# Patient Record
Sex: Female | Born: 1990 | Race: White | Hispanic: No | Marital: Married | Smoking: Current every day smoker
Health system: Southern US, Community
[De-identification: ages and names within clinical notes are randomized; demographics above are authoritative.]

## PROBLEM LIST (undated history)

## (undated) ENCOUNTER — Inpatient Hospital Stay (HOSPITAL_COMMUNITY): Payer: Self-pay

## (undated) DIAGNOSIS — K219 Gastro-esophageal reflux disease without esophagitis: Secondary | ICD-10-CM

## (undated) DIAGNOSIS — O039 Complete or unspecified spontaneous abortion without complication: Secondary | ICD-10-CM

## (undated) DIAGNOSIS — S3609XA Other injury of spleen, initial encounter: Secondary | ICD-10-CM

## (undated) DIAGNOSIS — N390 Urinary tract infection, site not specified: Secondary | ICD-10-CM

## (undated) DIAGNOSIS — Z9889 Other specified postprocedural states: Secondary | ICD-10-CM

## (undated) DIAGNOSIS — R112 Nausea with vomiting, unspecified: Secondary | ICD-10-CM

## (undated) DIAGNOSIS — O24419 Gestational diabetes mellitus in pregnancy, unspecified control: Secondary | ICD-10-CM

## (undated) HISTORY — PX: MOUTH SURGERY: SHX715

## (undated) HISTORY — DX: Gastro-esophageal reflux disease without esophagitis: K21.9

---
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
## 2012-05-22 ENCOUNTER — Emergency Department (HOSPITAL_COMMUNITY)
Admission: EM | Admit: 2012-05-22 | Discharge: 2012-05-22 | Disposition: A | Discharge status: Home / Self Care | Payer: 59 | Attending: Emergency Medicine | Admitting: Emergency Medicine

## 2012-05-22 ENCOUNTER — Encounter (HOSPITAL_COMMUNITY): Payer: Self-pay | Admitting: *Deleted

## 2012-05-22 DIAGNOSIS — M25569 Pain in unspecified knee: Secondary | ICD-10-CM | POA: Insufficient documentation

## 2012-05-22 DIAGNOSIS — Y9389 Activity, other specified: Secondary | ICD-10-CM | POA: Insufficient documentation

## 2012-05-22 DIAGNOSIS — Z862 Personal history of diseases of the blood and blood-forming organs and certain disorders involving the immune mechanism: Secondary | ICD-10-CM | POA: Insufficient documentation

## 2012-05-22 DIAGNOSIS — Y929 Unspecified place or not applicable: Secondary | ICD-10-CM | POA: Insufficient documentation

## 2012-05-22 DIAGNOSIS — M79609 Pain in unspecified limb: Secondary | ICD-10-CM | POA: Insufficient documentation

## 2012-05-22 DIAGNOSIS — F17200 Nicotine dependence, unspecified, uncomplicated: Secondary | ICD-10-CM | POA: Insufficient documentation

## 2012-05-22 DIAGNOSIS — Z888 Allergy status to other drugs, medicaments and biological substances status: Secondary | ICD-10-CM | POA: Insufficient documentation

## 2012-05-22 DIAGNOSIS — T7840XA Allergy, unspecified, initial encounter: Secondary | ICD-10-CM

## 2012-05-22 HISTORY — DX: Complete or unspecified spontaneous abortion without complication: O03.9

## 2012-05-22 HISTORY — DX: Other injury of spleen, initial encounter: S36.09XA

## 2012-05-22 NOTE — ED Notes (Signed)
States she has not taken Benadryl or any medication since this morning.

## 2012-05-22 NOTE — ED Notes (Signed)
Pt states she thinks she might be allergic to Advil.  Took this morning and around 11 am started noticing joint pain, later in the day noticed rash mostly on arms.  Denies itching, denies SOB, no difficulty breathing.

## 2012-05-22 NOTE — ED Provider Notes (Signed)
History    This chart was scribed for non-physician practitioner working with Catherine Razor, MD by Sofie Rower, ED Scribe. This patient was seen in room TR09C/TR09C and the patient's care was started at 9:07Pm.   CSN: [id number]  Arrival date & time 05/22/12  1945   First MD Initiated Contact with Patient 05/22/12 2107      Chief Complaint  Patient presents with  . Allergic Reaction    (Consider location/radiation/quality/duration/timing/severity/associated sxs/prior treatment) Patient is a 22 y.o. female presenting with allergic reaction and leg pain. The history is provided by the patient. No language interpreter was used.  Allergic Reaction The primary symptoms are  rash. The primary symptoms do not include shortness of breath. The current episode started 6 to 12 hours ago (10 hours ago). The problem has been gradually improving. This is a new problem.  The rash began today. Location: Previously noted rash has not resolved.  The pain associated with the rash is mild. The rash is not associated with itching.  Associated with: taking Advil at 11:00AM today. Significant symptoms that are not present include itching.  Leg Pain Location:  Leg Time since incident:  10 hours Injury: no   Leg location:  L leg and R leg Pain details:    Quality:  Aching   Radiates to:  Does not radiate   Severity:  Moderate   Onset quality:  Sudden   Duration:  10 hours   Timing:  Constant   Progression:  Improving Chronicity:  New Dislocation: no   Foreign body present:  No foreign bodies Tetanus status:  Unknown Prior injury to area:  No Relieved by:  Rest Worsened by:  Nothing tried Ineffective treatments:  None tried Associated symptoms: no itching     Catherine Ruiz is a 22 y.o. 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  Associated symptoms include leg pain located at the bilateral lower extremities. The pt reports she took Advil (X 2) this morning around 11:00AM, and shortly there after began to notice a diffuse rash, similar towards a prior experience when she ingested sulfa antibiotics. The pt informs that at the time of physical exam, the rash has now improved and dissipated. Modifying factors include ambulation which intensifies the bilateral lower extremity pain.  The pt denies sore throat and shortness of breath.  The pt is a current everyday smoker, however, she does not drink alcohol.   Pt does not have a PCP.    Past Medical History  Diagnosis Date  . Ruptured spleen   . Miscarriage     Past Surgical History  Procedure Laterality Date  . Appendectomy      History reviewed. No pertinent family history.  History  Substance Use Topics  . Smoking status: Current Every Day Smoker -- 1.00 packs/day  . Smokeless tobacco: Not on file  . Alcohol Use: No    OB History   Grav Para Term Preterm Abortions TAB SAB Ect Mult Living                  Review of Systems  HENT: Negative for sore throat.   Respiratory: Negative for shortness of breath.   Musculoskeletal: Positive for arthralgias.  Skin: Positive for rash. Negative for itching.  All other systems reviewed and are negative.    Allergies  Aspirin; Keflex; Penicillins; and  Sulfa antibiotics  Home Medications   Current Outpatient Rx  Name  Route  Sig  Dispense  Refill  . Ibuprofen (ADVIL PO)   Oral   Take 2 tablets by mouth every 6 (six) hours as needed (pain).           BP 129/86  Pulse 113  Temp(Src) 98.1 F (36.7 C) (Oral)  Resp 18  SpO2 100%  LMP 04/20/2012  Physical Exam  Nursing note and vitals reviewed. Constitutional: She is oriented to person, place, and time. She appears well-developed and well-nourished. No distress.  HENT:  Head: Normocephalic and atraumatic.  Eyes:  EOM are normal.  Neck: Neck supple. No tracheal deviation present.  Cardiovascular: Normal rate.   Pulmonary/Chest: Effort normal. No respiratory distress.  Musculoskeletal: Normal range of motion.  Bilateral knee discomfort.   Neurological: She is alert and oriented to person, place, and time.  Skin: Skin is warm and dry.  Previously noted rash now dissolved.   Psychiatric: She has a normal mood and affect. Her behavior is normal.    ED Course  Procedures (including critical care time)  DIAGNOSTIC STUDIES: Oxygen Saturation is 100% on room air, normal by my interpretation.    COORDINATION OF CARE:  9:22 PM- Treatment plan discussed with patient. Pt agrees with treatment.      Labs Reviewed - No data to display No results found.   No diagnosis found.  Patient developed a rash primarily on arms and bilateral knee pain after taking advil for a headache earlier today.  Patient has had similar symptoms after taking some antibiotics and aspirin.  Symptoms have continued to resolve over the course of the day.  No respiratory involvement.  MDM    I personally performed the services described in this documentation, which was scribed in my presence. The recorded information has been reviewed and is accurate.        Jimmye Norman, NP 05/22/12 2308

## 2012-05-26 NOTE — ED Provider Notes (Signed)
Medical screening examination/treatment/procedure(s) were performed by non-physician practitioner and as supervising physician I was immediately available for consultation/collaboration.  Raeford Razor, MD 05/26/12 [id number]

## 2012-07-28 ENCOUNTER — Encounter: Payer: Self-pay | Admitting: Family Medicine

## 2012-07-28 ENCOUNTER — Ambulatory Visit (INDEPENDENT_AMBULATORY_CARE_PROVIDER_SITE_OTHER): Payer: 59 | Admitting: Family Medicine

## 2012-07-28 VITALS — BP 80/63 | HR 84 | Temp 98.6°F | Ht 64.0 in | Wt 135.0 lb

## 2012-07-28 DIAGNOSIS — Z720 Tobacco use: Secondary | ICD-10-CM | POA: Insufficient documentation

## 2012-07-28 DIAGNOSIS — R300 Dysuria: Secondary | ICD-10-CM | POA: Insufficient documentation

## 2012-07-28 DIAGNOSIS — F17200 Nicotine dependence, unspecified, uncomplicated: Secondary | ICD-10-CM

## 2012-07-28 LAB — POCT UA - MICROSCOPIC ONLY

## 2012-07-28 LAB — POCT URINALYSIS DIPSTICK
Bilirubin, UA: NEGATIVE
Leukocytes, UA: NEGATIVE
Nitrite, UA: NEGATIVE
Protein, UA: 30
pH, UA: 6

## 2012-07-28 NOTE — Patient Instructions (Addendum)
It was nice to meet you today, Catherine Ruiz. We will let you know what the results are of your lab results. If you are interested in meeting with our Pharmacy clinic to discuss smoking cessation, please schedule appointment with Dr. Raymondo Band. Return to clinic in ONE year for a complete physical or sooner as needed.  Smoking Cessation, Tips for Success YOU CAN QUIT SMOKING If you are ready to quit smoking, congratulations! You have chosen to help yourself be healthier. Cigarettes bring nicotine, tar, carbon monoxide, and other irritants into your body. Your lungs, heart, and blood vessels will be able to work better without these poisons. There are many different ways to quit smoking. Nicotine gum, nicotine patches, a nicotine inhaler, or nicotine nasal spray can help with physical craving. Hypnosis, support groups, and medicines help break the habit of smoking. Here are some tips to help you quit for good.  Throw away all cigarettes.  Clean and remove all ashtrays from your home, work, and car.  On a card, write down your reasons for quitting. Carry the card with you and read it when you get the urge to smoke.  Cleanse your body of nicotine. Drink enough water and fluids to keep your urine clear or pale yellow. Do this after quitting to flush the nicotine from your body.  Learn to predict your moods. Do not let a bad situation be your excuse to have a cigarette. Some situations in your life might tempt you into wanting a cigarette.  Never have "just one" cigarette. It leads to wanting another and another. Remind yourself of your decision to quit.  Change habits associated with smoking. If you smoked while driving or when feeling stressed, try other activities to replace smoking. Stand up when drinking your coffee. Brush your teeth after eating. Sit in a different chair when you read the paper. Avoid alcohol while trying to quit, and try to drink fewer caffeinated beverages. Alcohol and caffeine may  urge you to smoke.  Avoid foods and drinks that can trigger a desire to smoke, such as sugary or spicy foods and alcohol.  Ask people who smoke not to smoke around you.  Have something planned to do right after eating or having a cup of coffee. Take a walk or exercise to perk you up. This will help to keep you from overeating.  Try a relaxation exercise to calm you down and decrease your stress. Remember, you may be tense and nervous for the first 2 weeks after you quit, but this will pass.  Find new activities to keep your hands busy. Play with a pen, coin, or rubber band. Doodle or draw things on paper.  Brush your teeth right after eating. This will help cut down on the craving for the taste of tobacco after meals. You can try mouthwash, too.  Use oral substitutes, such as lemon drops, carrots, a cinnamon stick, or chewing gum, in place of cigarettes. Keep them handy so they are available when you have the urge to smoke.  When you have the urge to smoke, try deep breathing.  Designate your home as a nonsmoking area.  If you are a heavy smoker, ask your caregiver about a prescription for nicotine chewing gum. It can ease your withdrawal from nicotine.  Reward yourself. Set aside the cigarette money you save and buy yourself something nice.  Look for support from others. Join a support group or smoking cessation program. Ask someone at home or at work to help you with  your plan to quit smoking.  Always ask yourself, "Do I need this cigarette or is this just a reflex?" Tell yourself, "Today, I choose not to smoke," or "I do not want to smoke." You are reminding yourself of your decision to quit, even if you do smoke a cigarette. HOW WILL I FEEL WHEN I QUIT SMOKING?  The benefits of not smoking start within days of quitting.  You may have symptoms of withdrawal because your body is used to nicotine (the addictive substance in cigarettes). You may crave cigarettes, be irritable, feel  very hungry, cough often, get headaches, or have difficulty concentrating.  The withdrawal symptoms are only temporary. They are strongest when you first quit but will go away within 10 to 14 days.  When withdrawal symptoms occur, stay in control. Think about your reasons for quitting. Remind yourself that these are signs that your body is healing and getting used to being without cigarettes.  Remember that withdrawal symptoms are easier to treat than the major diseases that smoking can cause.  Even after the withdrawal is over, expect periodic urges to smoke. However, these cravings are generally short-lived and will go away whether you smoke or not. Do not smoke!  If you relapse and smoke again, do not lose hope. Most smokers quit 3 times before they are successful.  If you relapse, do not give up! Plan ahead and think about what you will do the next time you get the urge to smoke. LIFE AS A NONSMOKER: MAKE IT FOR A MONTH, MAKE IT FOR LIFE Day 1: Hang this page where you will see it every day. Day 2: Get rid of all ashtrays, matches, and lighters. Day 3: Drink water. Breathe deeply between sips. Day 4: Avoid places with smoke-filled air, such as bars, clubs, or the smoking section of restaurants. Day 5: Keep track of how much money you save by not smoking. Day 6: Avoid boredom. Keep a good book with you or go to the movies. Day 7: Reward yourself! One week without smoking! Day 8: Make a dental appointment to get your teeth cleaned. Day 9: Decide how you will turn down a cigarette before it is offered to you. Day 10: Review your reasons for quitting. Day 11: Distract yourself. Stay active to keep your mind off smoking and to relieve tension. Take a walk, exercise, read a book, do a crossword puzzle, or try a new hobby. Day 12: Exercise. Get off the bus before your stop or use stairs instead of escalators. Day 13: Call on friends for support and encouragement. Day 14: Reward yourself! Two  weeks without smoking! Day 15: Practice deep breathing exercises. Day 16: Bet a friend that you can stay a nonsmoker. Day 17: Ask to sit in nonsmoking sections of restaurants. Day 18: Hang up "No Smoking" signs. Day 19: Think of yourself as a nonsmoker. Day 20: Each morning, tell yourself you will not smoke. Day 21: Reward yourself! Three weeks without smoking! Day 22: Think of smoking in negative ways. Remember how it stains your teeth, gives you bad breath, and leaves you short of breath. Day 23: Eat a nutritious breakfast. Day 24:Do not relive your days as a smoker. Day 25: Hold a pencil in your hand when talking on the telephone. Day 26: Tell all your friends you do not smoke. Day 27: Think about how much better food tastes. Day 28: Remember, one cigarette is one too many. Day 29: Take up a hobby that will keep  your hands busy. Day 30: Congratulations! One month without smoking! Give yourself a big reward. Your caregiver can direct you to community resources or hospitals for support, which may include:  Group support.  Education.  Hypnosis.  Subliminal therapy. Document Released: 12/02/2003 Document Revised: 05/28/2011 Document Reviewed: 12/20/2008 [facility] Patient Information 2013 [place], [place].

## 2012-07-28 NOTE — Assessment & Plan Note (Signed)
Will check UA today and notify of results.  No red flags or signs of pyelo today.

## 2012-07-28 NOTE — Progress Notes (Signed)
  Subjective:    Patient ID: Catherine Ruiz, female    DOB: Aug 22, 1990, 22 y.o.   MRN: [id number]  HPI  Patient here to establish care.    Dysuria: She has intermittent burning with urination.  Symptoms have been on and off for about 2 weeks.  She says she drinks a lot of soda, but not much water.  She has had UTI in the past and this feels similar to that.  No associated abdominal pain, but lately has had mild B/L flank pain.  No associated fever, nausea, or vomiting.  Denies any vaginal itching or discharge.  She is currently sexually active and uses condoms only sometimes.  She is not interested in birth control and would be happy if she became pregnant.  Otherwise, patient is doing well.  Eating a balanced diet and starting up an exercise routine.  She smokes 1 ppd, but is motivated to quit.  She has been smoking for 2 years and is trying to cut back.  I have reviewed and update PMH, FH, SH, Medications, Allergies, and Problem List.  Review of Systems Per HPI    Objective:   Physical Exam  Constitutional: She appears well-nourished. No distress.  Cardiovascular: Normal rate and normal heart sounds.   Pulmonary/Chest: Effort normal and breath sounds normal.  Abdominal: Soft. Bowel sounds are normal. She exhibits no distension. There is no rebound.  Musculoskeletal:  No CVA tenderness  Skin: No rash noted.      Assessment & Plan:

## 2012-07-28 NOTE — Assessment & Plan Note (Signed)
Discussed importance of smoking cessation today.  Gave patient handout and contact information for Pharmacy clinic if she cannot quit on her own.

## 2012-07-28 NOTE — Addendum Note (Signed)
Addended by: [place], Pernell Lenoir on: 07/28/2012 10:17 AM   Modules accepted: Orders

## 2013-04-13 ENCOUNTER — Encounter (HOSPITAL_COMMUNITY): Payer: Self-pay | Admitting: Emergency Medicine

## 2013-04-13 ENCOUNTER — Emergency Department (HOSPITAL_COMMUNITY)
Admission: EM | Admit: 2013-04-13 | Discharge: 2013-04-13 | Disposition: A | Discharge status: Home / Self Care | Payer: 59 | Source: Home / Self Care

## 2013-04-13 DIAGNOSIS — R6889 Other general symptoms and signs: Secondary | ICD-10-CM

## 2013-04-13 LAB — POCT RAPID STREP A: STREPTOCOCCUS, GROUP A SCREEN (DIRECT): NEGATIVE

## 2013-04-13 NOTE — Discharge Instructions (Signed)
You have a flu-like illness. Not clear if this is the flu yet. If you have a fever above 101 by 4pm tomorrow, give our office a call before 5. i would consider treating you with tamiflu. Otherwise, these illnesses are lasting several weeks. I would see us back if your symptoms aren't getting better by day 6 or if they worsen or if you have any other concerns. i would use tylenol or motrin for the headache.

## 2013-04-13 NOTE — ED Notes (Signed)
C/o headache.  Generalized body aches.  Low grade temp.  Chills.   No otc meds taken for symptom.  On set today.   Denies n/v/d

## 2013-04-13 NOTE — ED Provider Notes (Signed)
Catherine RazorMichelle Satterwhite is a 23 y.o. female who presents to Urgent Care today for body aches/headache  Woke up this morning with frontal headache and congestion as well as fever. Also had a sore throat. Started with fever throughout her body including down into her legs. Temperature up to 99.5 at home. Came on all of a sudden when she woke up, felt like she was getting hit by a truck. Got her flu shot this year. Symptoms worsening as the day has gone on. No sick contacts. Headache-feels like someone pounding in the back of her head 6/10. No medicine yet to this time. Did have slight headache last night for which she took tylenol. Patient sees family practice but has only had on visit. Mild sore throat.   Past Medical History  Diagnosis Date  . Ruptured spleen   . Miscarriage   . GERD (gastroesophageal reflux disease)    Surgical history-appendectomy   History  Substance Use Topics  . Smoking status: Current Every Day Smoker -- 1.00 packs/day for 2 years  . Smokeless tobacco: Current User  . Alcohol Use: No   ROS as above. No nausea/vomiting. No shortness of breath or chest pain. Endorsees fatigue. Mild cough.   Medications reviewed. None per patient.  Allergies-ASA, keflex, PCN, sulfa antibiotics  Exam:  BP 119/51  Pulse 100  Temp(Src) 98.3 F (36.8 C) (Oral)  Resp 18  SpO2 99%  LMP 03/19/2013 Gen: appears fatigued HEENT: EOMI,  MMM Pharynx: erythematous tonsils with small amount of exudate, otherwise normal Nares: mild rhinorrhea TM: normal bilaterally Neck: tender lymph node on anterior cervical chain.  Lungs: Normal work of breathing. CTABL Heart: RRR no MRG Abd: NABS, Soft. NT, ND Exts: Non edematous BL  LE, warm and well perfused.   Assessment and Plan: 23 y.o. female with flu-like illness (body aches, fever, fatigue). Did have tender lymph nodes on neck and some exudate on tonsils with associated mild sore throat-checked rapid strep which was negative. Given no recorded  temperature, this may be a weakened strain of flu due to flu shot or other viral illness. Asked patient to call family practice if febrile through tomorrow afternoon and we would instruct her whether to come in or if we could consider course of tamiflu based on history.       Shelva MajesticStephen O Apolo Cutshaw, MD 04/13/13 2119

## 2013-04-14 NOTE — ED Provider Notes (Signed)
Medical screening examination/treatment/procedure(s) were performed by resident physician or non-physician practitioner and as supervising physician I was immediately available for consultation/collaboration.   Renda Pohlman DOUGLAS MD.   Payzlee Ryder D Takelia Urieta, MD 04/14/13 1019 

## 2013-04-15 LAB — CULTURE, GROUP A STREP

## 2013-11-03 ENCOUNTER — Encounter (HOSPITAL_COMMUNITY): Payer: Self-pay | Admitting: Emergency Medicine

## 2013-11-03 ENCOUNTER — Encounter (HOSPITAL_COMMUNITY): Payer: Self-pay | Admitting: *Deleted

## 2013-11-03 ENCOUNTER — Emergency Department (HOSPITAL_COMMUNITY)
Admission: EM | Admit: 2013-11-03 | Discharge: 2013-11-03 | Disposition: A | Discharge status: Home / Self Care | Payer: 59 | Source: Home / Self Care | Attending: Family Medicine | Admitting: Family Medicine

## 2013-11-03 ENCOUNTER — Inpatient Hospital Stay (HOSPITAL_COMMUNITY): Payer: 59

## 2013-11-03 ENCOUNTER — Inpatient Hospital Stay (HOSPITAL_COMMUNITY)
Admission: AD | Admit: 2013-11-03 | Discharge: 2013-11-03 | Disposition: A | Discharge status: Home / Self Care | Payer: 59 | Source: Ambulatory Visit | Attending: Obstetrics & Gynecology | Admitting: Obstetrics & Gynecology

## 2013-11-03 DIAGNOSIS — F17200 Nicotine dependence, unspecified, uncomplicated: Secondary | ICD-10-CM | POA: Diagnosis not present

## 2013-11-03 DIAGNOSIS — N949 Unspecified condition associated with female genital organs and menstrual cycle: Secondary | ICD-10-CM

## 2013-11-03 DIAGNOSIS — N83209 Unspecified ovarian cyst, unspecified side: Secondary | ICD-10-CM | POA: Insufficient documentation

## 2013-11-03 DIAGNOSIS — K219 Gastro-esophageal reflux disease without esophagitis: Secondary | ICD-10-CM | POA: Insufficient documentation

## 2013-11-03 DIAGNOSIS — R1031 Right lower quadrant pain: Secondary | ICD-10-CM | POA: Diagnosis present

## 2013-11-03 DIAGNOSIS — Z880 Allergy status to penicillin: Secondary | ICD-10-CM | POA: Insufficient documentation

## 2013-11-03 DIAGNOSIS — R102 Pelvic and perineal pain: Secondary | ICD-10-CM

## 2013-11-03 LAB — WET PREP, GENITAL
TRICH WET PREP: NONE SEEN
Yeast Wet Prep HPF POC: NONE SEEN

## 2013-11-03 LAB — POCT URINALYSIS DIP (DEVICE)
BILIRUBIN URINE: NEGATIVE
Glucose, UA: NEGATIVE mg/dL
KETONES UR: NEGATIVE mg/dL
Nitrite: NEGATIVE
PH: 7 (ref 5.0–8.0)
PROTEIN: NEGATIVE mg/dL
SPECIFIC GRAVITY, URINE: 1.02 (ref 1.005–1.030)
Urobilinogen, UA: 0.2 mg/dL (ref 0.0–1.0)

## 2013-11-03 LAB — POCT PREGNANCY, URINE: Preg Test, Ur: NEGATIVE

## 2013-11-03 NOTE — ED Notes (Signed)
Notified Arlys JohnSuzanne Lineberry[facility]

## 2013-11-03 NOTE — ED Provider Notes (Signed)
CSN: [id number]     Arrival date & time 11/03/13  1356 History   First MD Initiated Contact with Patient 11/03/13 1512     Chief Complaint  Patient presents with  . Abdominal Pain   (Consider location/radiation/quality/duration/timing/severity/associated sxs/prior Treatment) Patient is a 23 y.o. female presenting with abdominal pain. The history is provided by the patient and the spouse.  Abdominal Pain Pain location:  Suprapubic Pain quality: cramping   Pain radiates to:  Does not radiate Pain severity:  Moderate Duration:  4 days Progression:  Worsening (this am had difficulty getting out of bed.) Chronicity:  New Associated symptoms: no dysuria, no nausea, no vaginal bleeding, no vaginal discharge and no vomiting   Risk factors comment:  S/p appy.   Past Medical History  Diagnosis Date  . Ruptured spleen   . Miscarriage   . GERD (gastroesophageal reflux disease)    Past Surgical History  Procedure Laterality Date  . Appendectomy     Family History  Problem Relation Age of Onset  . Asthma Sister   . Asthma Brother    History  Substance Use Topics  . Smoking status: Current Every Day Smoker -- 1.00 packs/day for 2 years  . Smokeless tobacco: Current User  . Alcohol Use: No   OB History   Grav Para Term Preterm Abortions TAB SAB Ect Mult Living                 Review of Systems  Constitutional: Negative.   Gastrointestinal: Positive for abdominal pain. Negative for nausea and vomiting.  Genitourinary: Negative for dysuria, vaginal bleeding and vaginal discharge.    Allergies  Aspirin; Keflex; Penicillins; and Sulfa antibiotics  Home Medications   Prior to Admission medications   Medication Sig Start Date End Date Taking? Authorizing Provider  Ibuprofen (ADVIL PO) Take 2 tablets by mouth every 6 (six) hours as needed (pain).    Historical Provider, MD   BP 125/80  Pulse 81  Temp(Src) 99 F (37.2 C) (Oral)  Resp 16  SpO2 100%  LMP 10/10/2013 Physical  Exam  Nursing note and vitals reviewed. Constitutional: She is oriented to person, place, and time. She appears well-developed and well-nourished. She appears distressed.  Abdominal: Soft. Bowel sounds are normal. She exhibits no distension and no mass. There is tenderness in the suprapubic area. There is no rebound and no guarding.    Neurological: She is alert and oriented to person, place, and time.  Skin: Skin is warm and dry.    ED Course  Procedures (including critical care time) Labs Review Labs Reviewed  POCT URINALYSIS DIP (DEVICE) - Abnormal; Notable for the following:    Hgb urine dipstick TRACE (*)    Leukocytes, UA TRACE (*)    All other components within normal limits  POCT PREGNANCY, URINE    Imaging Review No results found.   MDM   1. Pelvic pain in female    [facility]    Linna HoffJames D Ethyn Schetter, MD 11/03/13 1539

## 2013-11-03 NOTE — ED Notes (Signed)
[facility] and not to stop for any food; pt verbalized understanding.

## 2013-11-03 NOTE — ED Notes (Signed)
Pt c/o lower abd pain onset Saturday Sx also include: nauseas, urinary frequency Denies f/v/d Alert, no signs of acute distress.

## 2013-11-03 NOTE — Discharge Instructions (Signed)
Ovarian Cyst An ovarian cyst is a fluid-filled sac that forms on an ovary. The ovaries are small organs that produce eggs in women. Various types of cysts can form on the ovaries. Most are not cancerous. Many do not cause problems, and they often go away on their own. Some may cause symptoms and require treatment. Common types of ovarian cysts include:  Functional cysts--These cysts may occur every month during the menstrual cycle. This is normal. The cysts usually go away with the next menstrual cycle if the woman does not get pregnant. Usually, there are no symptoms with a functional cyst.  Endometrioma cysts--These cysts form from the tissue that lines the uterus. They are also called "chocolate cysts" because they become filled with blood that turns brown. This type of cyst can cause pain in the lower abdomen during intercourse and with your menstrual period.  Cystadenoma cysts--This type develops from the cells on the outside of the ovary. These cysts can get very big and cause lower abdomen pain and pain with intercourse. This type of cyst can twist on itself, cut off its blood supply, and cause severe pain. It can also easily rupture and cause a lot of pain.  Dermoid cysts--This type of cyst is sometimes found in both ovaries. These cysts may contain different kinds of body tissue, such as skin, teeth, hair, or cartilage. They usually do not cause symptoms unless they get very big.  Theca lutein cysts--These cysts occur when too much of a certain hormone (human chorionic gonadotropin) is produced and overstimulates the ovaries to produce an egg. This is most common after procedures used to assist with the conception of a baby (in vitro fertilization). CAUSES   Fertility drugs can cause a condition in which multiple large cysts are formed on the ovaries. This is called ovarian hyperstimulation syndrome.  A condition called polycystic ovary syndrome can cause hormonal imbalances that can lead to  nonfunctional ovarian cysts. SIGNS AND SYMPTOMS  Many ovarian cysts do not cause symptoms. If symptoms are present, they may include:  Pelvic pain or pressure.  Pain in the lower abdomen.  Pain during sexual intercourse.  Increasing girth (swelling) of the abdomen.  Abnormal menstrual periods.  Increasing pain with menstrual periods.  Stopping having menstrual periods without being pregnant. DIAGNOSIS  These cysts are commonly found during a routine or annual pelvic exam. Tests may be ordered to find out more about the cyst. These tests may include:  Ultrasound.  X-ray of the pelvis.  CT scan.  MRI.  Blood tests. TREATMENT  Many ovarian cysts go away on their own without treatment. Your health care provider may want to check your cyst regularly for 2-3 months to see if it changes. For women in menopause, it is particularly important to monitor a cyst closely because of the higher rate of ovarian cancer in menopausal women. When treatment is needed, it may include any of the following:  A procedure to drain the cyst (aspiration). This may be done using a long needle and ultrasound. It can also be done through a laparoscopic procedure. This involves using a thin, lighted tube with a tiny camera on the end (laparoscope) inserted through a small incision.  Surgery to remove the whole cyst. This may be done using laparoscopic surgery or an open surgery involving a larger incision in the lower abdomen.  Hormone treatment or birth control pills. These methods are sometimes used to help dissolve a cyst. HOME CARE INSTRUCTIONS   Only take over-the-counter   or prescription medicines as directed by your health care provider.  Follow up with your health care provider as directed.  Get regular pelvic exams and Pap tests. SEEK MEDICAL CARE IF:   Your periods are late, irregular, or painful, or they stop.  Your pelvic pain or abdominal pain does not go away.  Your abdomen becomes  larger or swollen.  You have pressure on your bladder or trouble emptying your bladder completely.  You have pain during sexual intercourse.  You have feelings of fullness, pressure, or discomfort in your stomach.  You lose weight for no apparent reason.  You feel generally ill.  You become constipated.  You lose your appetite.  You develop acne.  You have an increase in body and facial hair.  You are gaining weight, without changing your exercise and eating habits.  You think you are pregnant. SEEK IMMEDIATE MEDICAL CARE IF:   You have increasing abdominal pain.  You feel sick to your stomach (nauseous), and you throw up (vomit).  You develop a fever that comes on suddenly.  You have abdominal pain during a bowel movement.  Your menstrual periods become heavier than usual. MAKE SURE YOU:  Understand these instructions.  Will watch your condition.  Will get help right away if you are not doing well or get worse. Document Released: 03/05/2005 Document Revised: 03/10/2013 Document Reviewed: 11/10/2012 [employer] Patient Information 2015 [employer]. This information is not intended to replace advice given to you by your health care provider. Make sure you discuss any questions you have with your health care provider.  

## 2013-11-03 NOTE — Discharge Instructions (Signed)
[facility].

## 2013-11-03 NOTE — MAU Provider Note (Signed)
History     CSN: [id number]  Arrival date and time: 11/03/13 1555   None     Chief Complaint  Patient presents with  . Abdominal Pain   HPI Catherine Ruiz 23 y.o. G1P0010 presents to MAU with complaint of RLQ abdominal pain.  At noon today she notes acute onset of lower abdominal pain that is bilateral.  It radiates up the middle of her abdomen and to the back.  It was a 10/10 at its worst but has now dissipated.  It is described as sharp and continuous for several hours.   She had nausea when the pain was worse.  This occurred 4 days ago also - hurting for several hours and then easing off.  She denies vomiting, diarrhea, constipation, vaginal bleeding, discharge, fever.   No recent unusual physical activity.   OB History   Grav Para Term Preterm Abortions TAB SAB Ect Mult Living   1    1  1    0      Past Medical History  Diagnosis Date  . Ruptured spleen   . Miscarriage   . GERD (gastroesophageal reflux disease)     Past Surgical History  Procedure Laterality Date  . Appendectomy      Family History  Problem Relation Age of Onset  . Asthma Sister   . Asthma Brother     History  Substance Use Topics  . Smoking status: Current Every Day Smoker -- 1.00 packs/day for 2 years  . Smokeless tobacco: Current User  . Alcohol Use: No    Allergies:  Allergies  Allergen Reactions  . Aspirin Nausea Only  . Keflex [Cephalexin] Nausea And Vomiting  . Penicillins Nausea And Vomiting  . Sulfa Antibiotics Rash    Prescriptions prior to admission  Medication Sig Dispense Refill  . ranitidine (ZANTAC) 150 MG tablet Take 150 mg by mouth 2 (two) times daily.        Review of Systems  Constitutional: Negative for fever, chills and diaphoresis.  HENT: Negative for congestion and sore throat.   Eyes: Negative for blurred vision.  Respiratory: Negative for shortness of breath and wheezing.   Cardiovascular: Negative for chest pain and palpitations.  Gastrointestinal:  Positive for heartburn, nausea and abdominal pain. Negative for vomiting, diarrhea, constipation, blood in stool and melena.  Genitourinary: Positive for dysuria, urgency and frequency. Negative for hematuria and flank pain.  Skin: Negative for itching and rash.  Neurological: Negative for dizziness, tingling, sensory change, seizures, weakness and headaches.  Psychiatric/Behavioral: Negative for depression, suicidal ideas and substance abuse.   Physical Exam   Blood pressure 137/85, pulse 86, temperature 99 F (37.2 C), temperature source Oral, resp. rate 16, height 5\' 4"  (1.626 m), weight 74.662 kg (164 lb 9.6 oz), last menstrual period 10/10/2013, SpO2 100.00%.  Physical Exam  Constitutional: She is oriented to person, place, and time. She appears well-developed and well-nourished. No distress.  HENT:  Head: Normocephalic and atraumatic.  Eyes: EOM are normal.  Neck: Normal range of motion.  Cardiovascular: Normal rate, regular rhythm and normal heart sounds.   Respiratory: Effort normal and breath sounds normal. No respiratory distress.  GI: Soft. Bowel sounds are normal.  Genitourinary:  Vagina with mod amt of thin, white, frothy discharge. Cervix without discharge or friability.  Adnexa without tenderness or palpable mass No CMT.   Neurological: She is alert and oriented to person, place, and time.  Skin: Skin is warm and dry. She is not diaphoretic.  Psychiatric: She  has a normal mood and affect.   Results for orders placed during the hospital encounter of 11/03/13 (from the past 24 hour(s))  WET PREP, GENITAL     Status: Abnormal   Collection Time    11/03/13  5:20 PM      Result Value Ref Range   Yeast Wet Prep HPF POC NONE SEEN  NONE SEEN   Trich, Wet Prep NONE SEEN  NONE SEEN   Clue Cells Wet Prep HPF POC FEW (*) NONE SEEN   WBC, Wet Prep HPF POC MANY (*) NONE SEEN   [place] Transvaginal Non-ob  11/03/2013   CLINICAL DATA:  Pelvic pain.  EXAM: TRANSABDOMINAL AND  TRANSVAGINAL ULTRASOUND OF PELVIS  TECHNIQUE: Both transabdominal and transvaginal ultrasound examinations of the pelvis were performed. Transabdominal technique was performed for global imaging of the pelvis including uterus, ovaries, adnexal regions, and pelvic cul-de-sac. It was necessary to proceed with endovaginal exam following the transabdominal exam to visualize the ovaries and endometrium.  COMPARISON:  None  FINDINGS: Uterus  Measurements: 7.6 x 4.1 x 4.7 cm. No fibroids or other mass visualized.  Endometrium  Thickness: 1.2 mm.  No focal abnormality visualized.  Right ovary  Measurements: 2.9 x 1.9 x 3.9 cm. Slightly complex 1 cm cyst.  Left ovary  Measurements: 4.3 x 2.4 x 4.4 cm. 3.2 x 1.4 x 3.0 cm complex cyst, likely hemorrhagic cyst.  Other findings  Small amount of free pelvic fluid  IMPRESSION: 1. Normal sonographic appearance of the uterus. 2. Bilateral complex/hemorrhagic ovarian cyst. 3. Small amount of free pelvic fluid.   Electronically Signed   By: Loralie ChampagneMark  Gallerani M.D.   On: 11/03/2013 18:14   [place] Pelvis Complete  11/03/2013   CLINICAL DATA:  Pelvic pain.  EXAM: TRANSABDOMINAL AND TRANSVAGINAL ULTRASOUND OF PELVIS  TECHNIQUE: Both transabdominal and transvaginal ultrasound examinations of the pelvis were performed. Transabdominal technique was performed for global imaging of the pelvis including uterus, ovaries, adnexal regions, and pelvic cul-de-sac. It was necessary to proceed with endovaginal exam following the transabdominal exam to visualize the ovaries and endometrium.  COMPARISON:  None  FINDINGS: Uterus  Measurements: 7.6 x 4.1 x 4.7 cm. No fibroids or other mass visualized.  Endometrium  Thickness: 1.2 mm.  No focal abnormality visualized.  Right ovary  Measurements: 2.9 x 1.9 x 3.9 cm. Slightly complex 1 cm cyst.  Left ovary  Measurements: 4.3 x 2.4 x 4.4 cm. 3.2 x 1.4 x 3.0 cm complex cyst, likely hemorrhagic cyst.  Other findings  Small amount of free pelvic fluid  IMPRESSION:  1. Normal sonographic appearance of the uterus. 2. Bilateral complex/hemorrhagic ovarian cyst. 3. Small amount of free pelvic fluid.   Electronically Signed   By: Loralie ChampagneMark  Gallerani M.D.   On: 11/03/2013 18:14    MAU Course  Procedures Pelvic U/S  MDM Discussed with Dr. Macon LargeAnyanwu.  No follow up required  Assessment and Plan  Assessment: Ovarian Cyst  Plan: Discharge to home OTC ibuprofen for pain Encouraged to establish care with GYN.  Return to MAU PRN  Bertram Denvereague Clark, Ilea Hilton E 11/03/2013, 4:58 PM

## 2013-11-03 NOTE — MAU Note (Signed)
Patient states she had lower abdominal pain on 8-8 and it went away. States sudden onset of same pain today. Went to Urgent Care and sent to MAU for further evaluation. States abdomen is sore now but not as much pain. Tender to palpation by MD per patient. States some nausea on and off with the pain. Denies bleeding or discharge.

## 2013-11-04 LAB — GC/CHLAMYDIA PROBE AMP
CT PROBE, AMP APTIMA: NEGATIVE
GC PROBE AMP APTIMA: NEGATIVE

## 2013-12-02 ENCOUNTER — Ambulatory Visit (INDEPENDENT_AMBULATORY_CARE_PROVIDER_SITE_OTHER): Payer: 59 | Admitting: Obstetrics and Gynecology

## 2013-12-02 ENCOUNTER — Encounter: Payer: Self-pay | Admitting: Obstetrics and Gynecology

## 2013-12-02 VITALS — BP 119/70 | HR 76 | Wt 165.4 lb

## 2013-12-02 DIAGNOSIS — Z3480 Encounter for supervision of other normal pregnancy, unspecified trimester: Secondary | ICD-10-CM

## 2013-12-02 DIAGNOSIS — O0990 Supervision of high risk pregnancy, unspecified, unspecified trimester: Secondary | ICD-10-CM | POA: Insufficient documentation

## 2013-12-02 DIAGNOSIS — Z1151 Encounter for screening for human papillomavirus (HPV): Secondary | ICD-10-CM

## 2013-12-02 DIAGNOSIS — F17200 Nicotine dependence, unspecified, uncomplicated: Secondary | ICD-10-CM

## 2013-12-02 DIAGNOSIS — Z113 Encounter for screening for infections with a predominantly sexual mode of transmission: Secondary | ICD-10-CM

## 2013-12-02 DIAGNOSIS — Z720 Tobacco use: Secondary | ICD-10-CM

## 2013-12-02 DIAGNOSIS — Z124 Encounter for screening for malignant neoplasm of cervix: Secondary | ICD-10-CM

## 2013-12-02 DIAGNOSIS — Z3491 Encounter for supervision of normal pregnancy, unspecified, first trimester: Secondary | ICD-10-CM

## 2013-12-02 MED ORDER — DOXYLAMINE-PYRIDOXINE 10-10 MG PO TBEC: DELAYED_RELEASE_TABLET | ORAL | Status: DC

## 2013-12-02 NOTE — Addendum Note (Signed)
Addended by: Catalina Antigua on: 12/02/2013 11:06 AM   Modules accepted: Orders

## 2013-12-02 NOTE — Progress Notes (Addendum)
   Subjective:    Catherine Ruiz is a G2P0010 [redacted]w[redacted]d being seen today for her first obstetrical visit.  Her obstetrical history is significant for smoker. Patient does not intend to breast feed. Pregnancy history fully reviewed.  Patient reports nausea.  Filed Vitals:   12/02/13 0900  BP: 119/70  Pulse: 76  Weight: 165 lb 6.4 oz (75.025 kg)    HISTORY: OB History  Gravida Para Term Preterm AB SAB TAB Ectopic Multiple Living  0    # Outcome Date GA Lbr Len/2nd Weight Sex Delivery Anes PTL Lv  2 CUR           1 SAB              Past Medical History  Diagnosis Date  . Ruptured spleen   . Miscarriage   . GERD (gastroesophageal reflux disease)    Past Surgical History  Procedure Laterality Date  . Appendectomy     Family History  Problem Relation Age of Onset  . Asthma Sister   . Asthma Brother      Exam    Uterus:     Pelvic Exam:    Perineum: No Hemorrhoids, Normal Perineum   Vulva: normal   Vagina:  normal mucosa, normal discharge   pH:    Cervix: closed and long   Adnexa: normal adnexa and no mass, fullness, tenderness   Bony Pelvis: gynecoid  System: Breast:  normal appearance, no masses or tenderness   Skin: normal coloration and turgor, no rashes    Neurologic: oriented, no focal deficits   Extremities: normal strength, tone, and muscle mass   HEENT extra ocular movement intact   Mouth/Teeth mucous membranes moist, pharynx normal without lesions and dental hygiene good   Neck supple and no masses   Cardiovascular: regular rate and rhythm   Respiratory:  chest clear, no wheezing, crepitations, rhonchi, normal symmetric air entry   Abdomen: soft, non-tender; bowel sounds normal; no masses,  no organomegaly   Urinary:       Assessment:    Pregnancy: G2P0010 Patient Active Problem List   Diagnosis Date Noted  . Supervision of normal pregnancy 12/02/2013  . Dysuria 07/28/2012  . Tobacco abuse 07/28/2012        Plan:     Initial  labs drawn. Prenatal vitamins. Problem list reviewed and updated. Genetic Screening discussed First Screen: requested.  Ultrasound discussed; fetal survey: requested. Ultrasound shows IUP at [redacted]w[redacted]d Smoking cessation strategies discussed  Follow up in 4 weeks. 50% of 30 min visit spent on counseling and coordination of care.     Bostyn Kunkler 12/02/2013

## 2013-12-02 NOTE — Patient Instructions (Signed)
First Trimester of Pregnancy The first trimester of pregnancy is from week 1 until the end of week 12 (months 1 through 3). A week after a sperm fertilizes an egg, the egg will implant on the wall of the uterus. This embryo will begin to develop into a baby. Genes from you and your partner are forming the baby. The female genes determine whether the baby is a boy or a girl. At 6-8 weeks, the eyes and face are formed, and the heartbeat can be seen on ultrasound. At the end of 12 weeks, all the baby's organs are formed.  Now that you are pregnant, you will want to do everything you can to have a healthy baby. Two of the most important things are to get good prenatal care and to follow your health care provider's instructions. Prenatal care is all the medical care you receive before the baby's birth. This care will help prevent, find, and treat any problems during the pregnancy and childbirth. BODY CHANGES Your body goes through many changes during pregnancy. The changes vary from woman to woman.   You may gain or lose a couple of pounds at first.  You may feel sick to your stomach (nauseous) and throw up (vomit). If the vomiting is uncontrollable, call your health care provider.  You may tire easily.  You may develop headaches that can be relieved by medicines approved by your health care provider.  You may urinate more often. Painful urination may mean you have a bladder infection.  You may develop heartburn as a result of your pregnancy.  You may develop constipation because certain hormones are causing the muscles that push waste through your intestines to slow down.  You may develop hemorrhoids or swollen, bulging veins (varicose veins).  Your breasts may begin to grow larger and become tender. Your nipples may stick out more, and the tissue that surrounds them (areola) may become darker.  Your gums may bleed and may be sensitive to brushing and flossing.  Dark spots or blotches  (chloasma, mask of pregnancy) may develop on your face. This will likely fade after the baby is born.  Your menstrual periods will stop.  You may have a loss of appetite.  You may develop cravings for certain kinds of food.  You may have changes in your emotions from day to day, such as being excited to be pregnant or being concerned that something may go wrong with the pregnancy and baby.  You may have more vivid and strange dreams.  You may have changes in your hair. These can include thickening of your hair, rapid growth, and changes in texture. Some women also have hair loss during or after pregnancy, or hair that feels dry or thin. Your hair will most likely return to normal after your baby is born. WHAT TO EXPECT AT YOUR PRENATAL VISITS During a routine prenatal visit:  You will be weighed to make sure you and the baby are growing normally.  Your blood pressure will be taken.  Your abdomen will be measured to track your baby's growth.  The fetal heartbeat will be listened to starting around week 10 or 12 of your pregnancy.  Test results from any previous visits will be discussed. Your health care provider may ask you:  How you are feeling.  If you are feeling the baby move.  If you have had any abnormal symptoms, such as leaking fluid, bleeding, severe headaches, or abdominal cramping.  If you have any questions. Other tests   that may be performed during your first trimester include:  Blood tests to find your blood type and to check for the presence of any previous infections. They will also be used to check for low iron levels (anemia) and Rh antibodies. Later in the pregnancy, blood tests for diabetes will be done along with other tests if problems develop.  Urine tests to check for infections, diabetes, or protein in the urine.  An ultrasound to confirm the proper growth and development of the baby.  An amniocentesis to check for possible genetic problems.  Fetal  screens for spina bifida and Down syndrome.  You may need other tests to make sure you and the baby are doing well. HOME CARE INSTRUCTIONS  Medicines  Follow your health care provider's instructions regarding medicine use. Specific medicines may be either safe or unsafe to take during pregnancy.  Take your prenatal vitamins as directed.  If you develop constipation, try taking a stool softener if your health care provider approves. Diet  Eat regular, well-balanced meals. Choose a variety of foods, such as meat or vegetable-based protein, fish, milk and low-fat dairy products, vegetables, fruits, and whole grain breads and cereals. Your health care provider will help you determine the amount of weight gain that is right for you.  Avoid raw meat and uncooked cheese. These carry germs that can cause birth defects in the baby.  Eating four or five small meals rather than three large meals a day may help relieve nausea and vomiting. If you start to feel nauseous, eating a few soda crackers can be helpful. Drinking liquids between meals instead of during meals also seems to help nausea and vomiting.  If you develop constipation, eat more high-fiber foods, such as fresh vegetables or fruit and whole grains. Drink enough fluids to keep your urine clear or pale yellow. Activity and Exercise  Exercise only as directed by your health care provider. Exercising will help you:  Control your weight.  Stay in shape.  Be prepared for labor and delivery.  Experiencing pain or cramping in the lower abdomen or low back is a good sign that you should stop exercising. Check with your health care provider before continuing normal exercises.  Try to avoid standing for long periods of time. Move your legs often if you must stand in one place for a long time.  Avoid heavy lifting.  Wear low-heeled shoes, and practice good posture.  You may continue to have sex unless your health care provider directs you  otherwise. Relief of Pain or Discomfort  Wear a good support bra for breast tenderness.   Take warm sitz baths to soothe any pain or discomfort caused by hemorrhoids. Use hemorrhoid cream if your health care provider approves.   Rest with your legs elevated if you have leg cramps or low back pain.  If you develop varicose veins in your legs, wear support hose. Elevate your feet for 15 minutes, 3-4 times a day. Limit salt in your diet. Prenatal Care  Schedule your prenatal visits by the twelfth week of pregnancy. They are usually scheduled monthly at first, then more often in the last 2 months before delivery.  Write down your questions. Take them to your prenatal visits.  Keep all your prenatal visits as directed by your health care provider. Safety  Wear your seat belt at all times when driving.  Make a list of emergency phone numbers, including numbers for family, friends, the hospital, and police and fire departments. General Tips    Ask your health care provider for a referral to a local prenatal education class. Begin classes no later than at the beginning of month 6 of your pregnancy.  Ask for help if you have counseling or nutritional needs during pregnancy. Your health care provider can offer advice or refer you to specialists for help with various needs.  Do not use hot tubs, steam rooms, or saunas.  Do not douche or use tampons or scented sanitary pads.  Do not cross your legs for long periods of time.  Avoid cat litter boxes and soil used by cats. These carry germs that can cause birth defects in the baby and possibly loss of the fetus by miscarriage or stillbirth.  Avoid all smoking, herbs, alcohol, and medicines not prescribed by your health care provider. Chemicals in these affect the formation and growth of the baby.  Schedule a dentist appointment. At home, brush your teeth with a soft toothbrush and be gentle when you floss. SEEK MEDICAL CARE IF:   You have  dizziness.  You have mild pelvic cramps, pelvic pressure, or nagging pain in the abdominal area.  You have persistent nausea, vomiting, or diarrhea.  You have a bad smelling vaginal discharge.  You have pain with urination.  You notice increased swelling in your face, hands, legs, or ankles. SEEK IMMEDIATE MEDICAL CARE IF:   You have a fever.  You are leaking fluid from your vagina.  You have spotting or bleeding from your vagina.  You have severe abdominal cramping or pain.  You have rapid weight gain or loss.  You vomit blood or material that looks like coffee grounds.  You are exposed to German measles and have never had them.  You are exposed to fifth disease or chickenpox.  You develop a severe headache.  You have shortness of breath.  You have any kind of trauma, such as from a fall or a car accident. Document Released: 02/27/2001 Document Revised: 07/20/2013 Document Reviewed: 01/13/2013 [employer] Patient Information 2015 [employer]. This information is not intended to replace advice given to you by your health care provider. Make sure you discuss any questions you have with your health care provider.  Contraception Choices Contraception (birth control) is the use of any methods or devices to prevent pregnancy. Below are some methods to help avoid pregnancy. HORMONAL METHODS   Contraceptive implant. This is a thin, plastic tube containing progesterone hormone. It does not contain estrogen hormone. Your health care provider inserts the tube in the inner part of the upper arm. The tube can remain in place for up to 3 years. After 3 years, the implant must be removed. The implant prevents the ovaries from releasing an egg (ovulation), thickens the cervical mucus to prevent sperm from entering the uterus, and thins the lining of the inside of the uterus.  Progesterone-only injections. These injections are given every 3 months by your health care provider to prevent  pregnancy. This synthetic progesterone hormone stops the ovaries from releasing eggs. It also thickens cervical mucus and changes the uterine lining. This makes it harder for sperm to survive in the uterus.  Birth control pills. These pills contain estrogen and progesterone hormone. They work by preventing the ovaries from releasing eggs (ovulation). They also cause the cervical mucus to thicken, preventing the sperm from entering the uterus. Birth control pills are prescribed by a health care provider.Birth control pills can also be used to treat heavy periods.  Minipill. This type of birth control pill contains   only the progesterone hormone. They are taken every day of each month and must be prescribed by your health care provider.  Birth control patch. The patch contains hormones similar to those in birth control pills. It must be changed once a week and is prescribed by a health care provider.  Vaginal ring. The ring contains hormones similar to those in birth control pills. It is left in the vagina for 3 weeks, removed for 1 week, and then a new one is put back in place. The patient must be comfortable inserting and removing the ring from the vagina.A health care provider's prescription is necessary.  Emergency contraception. Emergency contraceptives prevent pregnancy after unprotected sexual intercourse. This pill can be taken right after sex or up to 5 days after unprotected sex. It is most effective the sooner you take the pills after having sexual intercourse. Most emergency contraceptive pills are available without a prescription. Check with your pharmacist. Do not use emergency contraception as your only form of birth control. BARRIER METHODS   Female condom. This is a thin sheath (latex or rubber) that is worn over the penis during sexual intercourse. It can be used with spermicide to increase effectiveness.  Female condom. This is a soft, loose-fitting sheath that is put into the vagina  before sexual intercourse.  Diaphragm. This is a soft, latex, dome-shaped barrier that must be fitted by a health care provider. It is inserted into the vagina, along with a spermicidal jelly. It is inserted before intercourse. The diaphragm should be left in the vagina for 6 to 8 hours after intercourse.  Cervical cap. This is a round, soft, latex or plastic cup that fits over the cervix and must be fitted by a health care provider. The cap can be left in place for up to 48 hours after intercourse.  Sponge. This is a soft, circular piece of polyurethane foam. The sponge has spermicide in it. It is inserted into the vagina after wetting it and before sexual intercourse.  Spermicides. These are chemicals that kill or block sperm from entering the cervix and uterus. They come in the form of creams, jellies, suppositories, foam, or tablets. They do not require a prescription. They are inserted into the vagina with an applicator before having sexual intercourse. The process must be repeated every time you have sexual intercourse. INTRAUTERINE CONTRACEPTION  Intrauterine device (IUD). This is a T-shaped device that is put in a woman's uterus during a menstrual period to prevent pregnancy. There are 2 types:  Copper IUD. This type of IUD is wrapped in copper wire and is placed inside the uterus. Copper makes the uterus and fallopian tubes produce a fluid that kills sperm. It can stay in place for 10 years.  Hormone IUD. This type of IUD contains the hormone progestin (synthetic progesterone). The hormone thickens the cervical mucus and prevents sperm from entering the uterus, and it also thins the uterine lining to prevent implantation of a fertilized egg. The hormone can weaken or kill the sperm that get into the uterus. It can stay in place for 3-5 years, depending on which type of IUD is used. PERMANENT METHODS OF CONTRACEPTION  Female tubal ligation. This is when the woman's fallopian tubes are  surgically sealed, tied, or blocked to prevent the egg from traveling to the uterus.  Hysteroscopic sterilization. This involves placing a small coil or insert into each fallopian tube. Your doctor uses a technique called hysteroscopy to do the procedure. The device causes scar tissue   to form. This results in permanent blockage of the fallopian tubes, so the sperm cannot fertilize the egg. It takes about 3 months after the procedure for the tubes to become blocked. You must use another form of birth control for these 3 months.  Female sterilization. This is when the female has the tubes that carry sperm tied off (vasectomy).This blocks sperm from entering the vagina during sexual intercourse. After the procedure, the man can still ejaculate fluid (semen). NATURAL PLANNING METHODS  Natural family planning. This is not having sexual intercourse or using a barrier method (condom, diaphragm, cervical cap) on days the woman could become pregnant.  Calendar method. This is keeping track of the length of each menstrual cycle and identifying when you are fertile.  Ovulation method. This is avoiding sexual intercourse during ovulation.  Symptothermal method. This is avoiding sexual intercourse during ovulation, using a thermometer and ovulation symptoms.  Post-ovulation method. This is timing sexual intercourse after you have ovulated. Regardless of which type or method of contraception you choose, it is important that you use condoms to protect against the transmission of sexually transmitted infections (STIs). Talk with your health care provider about which form of contraception is most appropriate for you. Document Released: 03/05/2005 Document Revised: 03/10/2013 Document Reviewed: 08/28/2012 [employer] Patient Information 2015 [employer]. This information is not intended to replace advice given to you by your health care provider. Make sure you discuss any questions you have with your health care  provider.  Breastfeeding Deciding to breastfeed is one of the best choices you can make for you and your baby. A change in hormones during pregnancy causes your breast tissue to grow and increases the number and size of your milk ducts. These hormones also allow proteins, sugars, and fats from your blood supply to make breast milk in your milk-producing glands. Hormones prevent breast milk from being released before your baby is born as well as prompt milk flow after birth. Once breastfeeding has begun, thoughts of your baby, as well as his or her sucking or crying, can stimulate the release of milk from your milk-producing glands.  BENEFITS OF BREASTFEEDING For Your Baby  Your first milk (colostrum) helps your baby's digestive system function better.   There are antibodies in your milk that help your baby fight off infections.   Your baby has a lower incidence of asthma, allergies, and sudden infant death syndrome.   The nutrients in breast milk are better for your baby than infant formulas and are designed uniquely for your baby's needs.   Breast milk improves your baby's brain development.   Your baby is less likely to develop other conditions, such as childhood obesity, asthma, or type 2 diabetes mellitus.  For You   Breastfeeding helps to create a very special bond between you and your baby.   Breastfeeding is convenient. Breast milk is always available at the correct temperature and costs nothing.   Breastfeeding helps to burn calories and helps you lose the weight gained during pregnancy.   Breastfeeding makes your uterus contract to its prepregnancy size faster and slows bleeding (lochia) after you give birth.   Breastfeeding helps to lower your risk of developing type 2 diabetes mellitus, osteoporosis, and breast or ovarian cancer later in life. SIGNS THAT YOUR BABY IS HUNGRY Early Signs of Hunger  Increased alertness or activity.  Stretching.  Movement of the  head from side to side.  Movement of the head and opening of the mouth when the corner   of the mouth or cheek is stroked (rooting).  Increased sucking sounds, smacking lips, cooing, sighing, or squeaking.  Hand-to-mouth movements.  Increased sucking of fingers or hands. Late Signs of Hunger  Fussing.  Intermittent crying. Extreme Signs of Hunger Signs of extreme hunger will require calming and consoling before your baby will be able to breastfeed successfully. Do not wait for the following signs of extreme hunger to occur before you initiate breastfeeding:   Restlessness.  A loud, strong cry.   Screaming. BREASTFEEDING BASICS Breastfeeding Initiation  Find a comfortable place to sit or lie down, with your neck and back well supported.  Place a pillow or rolled up blanket under your baby to bring him or her to the level of your breast (if you are seated). Nursing pillows are specially designed to help support your arms and your baby while you breastfeed.  Make sure that your baby's abdomen is facing your abdomen.   Gently massage your breast. With your fingertips, massage from your chest wall toward your nipple in a circular motion. This encourages milk flow. You may need to continue this action during the feeding if your milk flows slowly.  Support your breast with 4 fingers underneath and your thumb above your nipple. Make sure your fingers are well away from your nipple and your baby's mouth.   Stroke your baby's lips gently with your finger or nipple.   When your baby's mouth is open wide enough, quickly bring your baby to your breast, placing your entire nipple and as much of the colored area around your nipple (areola) as possible into your baby's mouth.   More areola should be visible above your baby's upper lip than below the lower lip.   Your baby's tongue should be between his or her lower gum and your breast.   Ensure that your baby's mouth is correctly  positioned around your nipple (latched). Your baby's lips should create a seal on your breast and be turned out (everted).  It is common for your baby to suck about 2-3 minutes in order to start the flow of breast milk. Latching Teaching your baby how to latch on to your breast properly is very important. An improper latch can cause nipple pain and decreased milk supply for you and poor weight gain in your baby. Also, if your baby is not latched onto your nipple properly, he or she may swallow some air during feeding. This can make your baby fussy. Burping your baby when you switch breasts during the feeding can help to get rid of the air. However, teaching your baby to latch on properly is still the best way to prevent fussiness from swallowing air while breastfeeding. Signs that your baby has successfully latched on to your nipple:    Silent tugging or silent sucking, without causing you pain.   Swallowing heard between every 3-4 sucks.    Muscle movement above and in front of his or her ears while sucking.  Signs that your baby has not successfully latched on to nipple:   Sucking sounds or smacking sounds from your baby while breastfeeding.  Nipple pain. If you think your baby has not latched on correctly, slip your finger into the corner of your baby's mouth to break the suction and place it between your baby's gums. Attempt breastfeeding initiation again. Signs of Successful Breastfeeding Signs from your baby:   A gradual decrease in the number of sucks or complete cessation of sucking.   Falling asleep.     Relaxation of his or her body.   Retention of a small amount of milk in his or her mouth.   Letting go of your breast by himself or herself. Signs from you:  Breasts that have increased in firmness, weight, and size 1-3 hours after feeding.   Breasts that are softer immediately after breastfeeding.  Increased milk volume, as well as a change in milk consistency  and color by the fifth day of breastfeeding.   Nipples that are not sore, cracked, or bleeding. Signs That Your Baby is Getting Enough Milk  Wetting at least 3 diapers in a 24-hour period. The urine should be clear and pale yellow by age 5 days.  At least 3 stools in a 24-hour period by age 5 days. The stool should be soft and yellow.  At least 3 stools in a 24-hour period by age 7 days. The stool should be seedy and yellow.  No loss of weight greater than 10% of birth weight during the first 3 days of age.  Average weight gain of 4-7 ounces (113-198 g) per week after age 4 days.  Consistent daily weight gain by age 5 days, without weight loss after the age of 2 weeks. After a feeding, your baby may spit up a small amount. This is common. BREASTFEEDING FREQUENCY AND DURATION Frequent feeding will help you make more milk and can prevent sore nipples and breast engorgement. Breastfeed when you feel the need to reduce the fullness of your breasts or when your baby shows signs of hunger. This is called "breastfeeding on demand." Avoid introducing a pacifier to your baby while you are working to establish breastfeeding (the first 4-6 weeks after your baby is born). After this time you may choose to use a pacifier. Research has shown that pacifier use during the first year of a baby's life decreases the risk of sudden infant death syndrome (SIDS). Allow your baby to feed on each breast as long as he or she wants. Breastfeed until your baby is finished feeding. When your baby unlatches or falls asleep while feeding from the first breast, offer the second breast. Because newborns are often sleepy in the first few weeks of life, you may need to awaken your baby to get him or her to feed. Breastfeeding times will vary from baby to baby. However, the following rules can serve as a guide to help you ensure that your baby is properly fed:  Newborns (babies 4 weeks of age or younger) may breastfeed every  1-3 hours.  Newborns should not go longer than 3 hours during the day or 5 hours during the night without breastfeeding.  You should breastfeed your baby a minimum of 8 times in a 24-hour period until you begin to introduce solid foods to your baby at around 6 months of age. BREAST MILK PUMPING Pumping and storing breast milk allows you to ensure that your baby is exclusively fed your breast milk, even at times when you are unable to breastfeed. This is especially important if you are going back to work while you are still breastfeeding or when you are not able to be present during feedings. Your lactation consultant can give you guidelines on how long it is safe to store breast milk.  A breast pump is a machine that allows you to pump milk from your breast into a sterile bottle. The pumped breast milk can then be stored in a refrigerator or freezer. Some breast pumps are operated by hand, while others   use electricity. Ask your lactation consultant which type will work best for you. Breast pumps can be purchased, but some hospitals and breastfeeding support groups lease breast pumps on a monthly basis. A lactation consultant can teach you how to hand express breast milk, if you prefer not to use a pump.  CARING FOR YOUR BREASTS WHILE YOU BREASTFEED Nipples can become dry, cracked, and sore while breastfeeding. The following recommendations can help keep your breasts moisturized and healthy:  Avoid using soap on your nipples.   Wear a supportive bra. Although not required, special nursing bras and tank tops are designed to allow access to your breasts for breastfeeding without taking off your entire bra or top. Avoid wearing underwire-style bras or extremely tight bras.  Air dry your nipples for 3-4minutes after each feeding.   Use only cotton bra pads to absorb leaked breast milk. Leaking of breast milk between feedings is normal.   Use lanolin on your nipples after breastfeeding. Lanolin  helps to maintain your skin's normal moisture barrier. If you use pure lanolin, you do not need to wash it off before feeding your baby again. Pure lanolin is not toxic to your baby. You may also hand express a few drops of breast milk and gently massage that milk into your nipples and allow the milk to air dry. In the first few weeks after giving birth, some women experience extremely full breasts (engorgement). Engorgement can make your breasts feel heavy, warm, and tender to the touch. Engorgement peaks within 3-5 days after you give birth. The following recommendations can help ease engorgement:  Completely empty your breasts while breastfeeding or pumping. You may want to start by applying warm, moist heat (in the shower or with warm water-soaked hand towels) just before feeding or pumping. This increases circulation and helps the milk flow. If your baby does not completely empty your breasts while breastfeeding, pump any extra milk after he or she is finished.  Wear a snug bra (nursing or regular) or tank top for 1-2 days to signal your body to slightly decrease milk production.  Apply ice packs to your breasts, unless this is too uncomfortable for you.  Make sure that your baby is latched on and positioned properly while breastfeeding. If engorgement persists after 48 hours of following these recommendations, contact your health care provider or a lactation consultant. OVERALL HEALTH CARE RECOMMENDATIONS WHILE BREASTFEEDING  Eat healthy foods. Alternate between meals and snacks, eating 3 of each per day. Because what you eat affects your breast milk, some of the foods may make your baby more irritable than usual. Avoid eating these foods if you are sure that they are negatively affecting your baby.  Drink milk, fruit juice, and water to satisfy your thirst (about 10 glasses a day).   Rest often, relax, and continue to take your prenatal vitamins to prevent fatigue, stress, and  anemia.  Continue breast self-awareness checks.  Avoid chewing and smoking tobacco.  Avoid alcohol and drug use. Some medicines that may be harmful to your baby can pass through breast milk. It is important to ask your health care provider before taking any medicine, including all over-the-counter and prescription medicine as well as vitamin and herbal supplements. It is possible to become pregnant while breastfeeding. If birth control is desired, ask your health care provider about options that will be safe for your baby. SEEK MEDICAL CARE IF:   You feel like you want to stop breastfeeding or have become frustrated with breastfeeding.    You have painful breasts or nipples.  Your nipples are cracked or bleeding.  Your breasts are red, tender, or warm.  You have a swollen area on either breast.  You have a fever or chills.  You have nausea or vomiting.  You have drainage other than breast milk from your nipples.  Your breasts do not become full before feedings by the fifth day after you give birth.  You feel sad and depressed.  Your baby is too sleepy to eat well.  Your baby is having trouble sleeping.   Your baby is wetting less than 3 diapers in a 24-hour period.  Your baby has less than 3 stools in a 24-hour period.  Your baby's skin or the white part of his or her eyes becomes yellow.   Your baby is not gaining weight by 5 days of age. SEEK IMMEDIATE MEDICAL CARE IF:   Your baby is overly tired (lethargic) and does not want to wake up and feed.  Your baby develops an unexplained fever. Document Released: 03/05/2005 Document Revised: 03/10/2013 Document Reviewed: 08/27/2012 [employer] Patient Information 2015 [employer]. This information is not intended to replace advice given to you by your health care provider. Make sure you discuss any questions you have with your health care provider.  

## 2013-12-03 LAB — OBSTETRIC PANEL
ANTIBODY SCREEN: NEGATIVE
BASOS ABS: 0 10*3/uL (ref 0.0–0.1)
BASOS PCT: 0 % (ref 0–1)
EOS PCT: 1 % (ref 0–5)
Eosinophils Absolute: 0.1 10*3/uL (ref 0.0–0.7)
HEMATOCRIT: 38.5 % (ref 36.0–46.0)
HEMOGLOBIN: 12.7 g/dL (ref 12.0–15.0)
Hepatitis B Surface Ag: NEGATIVE
Lymphocytes Relative: 20 % (ref 12–46)
Lymphs Abs: 1.9 10*3/uL (ref 0.7–4.0)
MCH: 29.7 pg (ref 26.0–34.0)
MCHC: 33 g/dL (ref 30.0–36.0)
MCV: 90 fL (ref 78.0–100.0)
MONO ABS: 0.7 10*3/uL (ref 0.1–1.0)
MONOS PCT: 7 % (ref 3–12)
NEUTROS ABS: 7 10*3/uL (ref 1.7–7.7)
Neutrophils Relative %: 72 % (ref 43–77)
Platelets: 337 10*3/uL (ref 150–400)
RBC: 4.28 MIL/uL (ref 3.87–5.11)
RDW: 13.6 % (ref 11.5–15.5)
RH TYPE: POSITIVE
RUBELLA: 0.87 {index} (ref <0.90)
WBC: 9.7 10*3/uL (ref 4.0–10.5)

## 2013-12-03 LAB — HIV ANTIBODY (ROUTINE TESTING W REFLEX): HIV: NONREACTIVE

## 2013-12-04 LAB — URINE CULTURE
Colony Count: NO GROWTH
ORGANISM ID, BACTERIA: NO GROWTH

## 2013-12-07 ENCOUNTER — Other Ambulatory Visit: Payer: Self-pay | Admitting: Obstetrics and Gynecology

## 2013-12-07 DIAGNOSIS — Z3682 Encounter for antenatal screening for nuchal translucency: Secondary | ICD-10-CM

## 2013-12-07 LAB — CYTOLOGY - PAP

## 2013-12-29 ENCOUNTER — Inpatient Hospital Stay (HOSPITAL_COMMUNITY)
Admission: AD | Admit: 2013-12-29 | Discharge: 2013-12-29 | Disposition: A | Discharge status: Home / Self Care | Payer: 59 | Source: Ambulatory Visit | Attending: Obstetrics & Gynecology | Admitting: Obstetrics & Gynecology

## 2013-12-29 ENCOUNTER — Encounter (HOSPITAL_COMMUNITY): Payer: Self-pay | Admitting: *Deleted

## 2013-12-29 DIAGNOSIS — O219 Vomiting of pregnancy, unspecified: Secondary | ICD-10-CM

## 2013-12-29 DIAGNOSIS — O210 Mild hyperemesis gravidarum: Secondary | ICD-10-CM

## 2013-12-29 DIAGNOSIS — Z3491 Encounter for supervision of normal pregnancy, unspecified, first trimester: Secondary | ICD-10-CM

## 2013-12-29 DIAGNOSIS — Z3A11 11 weeks gestation of pregnancy: Secondary | ICD-10-CM | POA: Diagnosis not present

## 2013-12-29 DIAGNOSIS — O99331 Smoking (tobacco) complicating pregnancy, first trimester: Secondary | ICD-10-CM | POA: Insufficient documentation

## 2013-12-29 LAB — URINALYSIS, ROUTINE W REFLEX MICROSCOPIC
BILIRUBIN URINE: NEGATIVE
Glucose, UA: NEGATIVE mg/dL
KETONES UR: 15 mg/dL — AB
NITRITE: NEGATIVE
PH: 6 (ref 5.0–8.0)
Protein, ur: NEGATIVE mg/dL
SPECIFIC GRAVITY, URINE: 1.015 (ref 1.005–1.030)
UROBILINOGEN UA: 0.2 mg/dL (ref 0.0–1.0)

## 2013-12-29 LAB — URINE MICROSCOPIC-ADD ON

## 2013-12-29 MED ORDER — PROMETHAZINE HCL 25 MG PO TABS: 12.5000 mg | ORAL_TABLET | Freq: Four times a day (QID) | ORAL | Status: DC | PRN

## 2013-12-29 MED ORDER — PROMETHAZINE HCL 25 MG PO TABS
25.0000 mg | ORAL_TABLET | Freq: Once | ORAL | Status: AC
  Administered 2013-12-29: 25 mg via ORAL
  Filled 2013-12-29: qty 1

## 2013-12-29 NOTE — MAU Note (Signed)
Patient states she has had nausea for the entire pregnancy and has been on Diclegis but is has not been working well. Has not been able to keep anything down for the past 3 days. Denies pain, bleeding or discharge.

## 2013-12-29 NOTE — MAU Note (Signed)
Nausea and vomitng has been on going for about 3 days. Pt states she has been taken medication for nausea, that pt states is not been working .

## 2013-12-29 NOTE — MAU Provider Note (Signed)
History     CSN: [id number]  Arrival date and time: 12/29/13 1522   None     Chief Complaint  Patient presents with  . Emesis During Pregnancy   Patient is a 23 y.o. female presenting with vomiting. The history is provided by the patient.  Emesis  The current episode started more than 1 week ago. The problem has been gradually improving. There has been no fever.   Bonnielee HaffMichelle Knippenberg is a 23 y.o. G2P0010 @ 530w6d gestation who presents to the MAU with nausea. She states that she has had problems since early in the pregnancy and has been taking Diclegis with only mild relief. She states that 4 days ago she was vomiting every time she ate and then a little less the day after and the past 2 days she has vomited once a day. She has been drinking water since arrival and has been able to keep it down. She states that [facility] and was instructed to come to MAU for evaluation.    OB History   Grav Para Term Preterm Abortions TAB SAB Ect Mult Living   2    1  1    0      Past Medical History  Diagnosis Date  . Ruptured spleen   . Miscarriage   . GERD (gastroesophageal reflux disease)     Past Surgical History  Procedure Laterality Date  . Appendectomy      Family History  Problem Relation Age of Onset  . Asthma Sister   . Asthma Brother     History  Substance Use Topics  . Smoking status: Current Every Day Smoker -- 1.00 packs/day for 2 years  . Smokeless tobacco: Current User     Comment: cutting back/trying to quit  . Alcohol Use: No    Allergies:  Allergies  Allergen Reactions  . Aspirin Nausea Only  . Keflex [Cephalexin] Nausea And Vomiting  . Penicillins Nausea And Vomiting  . Sulfa Antibiotics Rash    Prescriptions prior to admission  Medication Sig Dispense Refill  . Doxylamine-Pyridoxine (DICLEGIS) 10-10 MG TBEC Take 2 tablets orally at bedtime. If symptoms persists increase to 1 tablet in the morning and 2 tablets at  bedtime  60 tablet  3  . Prenatal Multivit-Min-Fe-FA (PRENATAL VITAMINS PO) Take by mouth.      . ranitidine (ZANTAC) 150 MG tablet Take 150 mg by mouth 2 (two) times daily.        Review of Systems  Gastrointestinal: Positive for nausea and vomiting.  All other systems negative  Blood pressure 125/72, pulse 100, temperature 99.2 F (37.3 C), temperature source Oral, resp. rate 16, height 5' 4.5" (1.638 m), weight 157 lb 9.6 oz (71.487 kg), last menstrual period 10/07/2013, SpO2 100.00%.  Physical Exam  Constitutional: She is oriented to person, place, and time. She appears well-developed and well-nourished. No distress.  HENT:  Head: Normocephalic and atraumatic.  Eyes: Conjunctivae and EOM are normal.  Neck: Neck supple.  Cardiovascular: Normal rate.   Respiratory: Effort normal.  GI: Soft. Bowel sounds are normal. There is no tenderness.  Musculoskeletal: Normal range of motion.  Neurological: She is alert and oriented to person, place, and time.  Skin: Skin is warm and dry.  Psychiatric: She has a normal mood and affect. Her behavior is normal. Judgment and thought content normal.    MAU Course  Procedures Results for orders placed during the hospital encounter of 12/29/13 (from the  past 24 hour(s))  URINALYSIS, ROUTINE W REFLEX MICROSCOPIC     Status: Abnormal   Collection Time    12/29/13  4:15 PM      Result Value Ref Range   Color, Urine YELLOW  YELLOW   APPearance CLEAR  CLEAR   Specific Gravity, Urine 1.015  1.005 - 1.030   pH 6.0  5.0 - 8.0   Glucose, UA NEGATIVE  NEGATIVE mg/dL   Hgb urine dipstick TRACE (*) NEGATIVE   Bilirubin Urine NEGATIVE  NEGATIVE   Ketones, ur 15 (*) NEGATIVE mg/dL   Protein, ur NEGATIVE  NEGATIVE mg/dL   Urobilinogen, UA 0.2  0.0 - 1.0 mg/dL   Nitrite NEGATIVE  NEGATIVE   Leukocytes, UA TRACE (*) NEGATIVE  URINE MICROSCOPIC-ADD ON     Status: Abnormal   Collection Time    12/29/13  4:15 PM      Result Value Ref Range   Squamous  Epithelial / LPF FEW (*) RARE   WBC, UA 0-2  <3 WBC/hpf   RBC / HPF 0-2  <3 RBC/hpf    Phenergan 25 mg PO MDM After Phenergan PO patient is taking PO fluids and crackers without nausea.  Assessment and Plan  23 y.o. female with nausea and vomiting in pregnancy. Will treat her symptoms and she will return for worsening symptoms. Stable for discharge without signs of dehydration at this time. I discussed plan of care with the patient and she voices understanding and agrees with plan.   NEESE,HOPE 12/29/2013, 4:21 PM

## 2013-12-30 ENCOUNTER — Ambulatory Visit (INDEPENDENT_AMBULATORY_CARE_PROVIDER_SITE_OTHER): Payer: 59 | Admitting: Family Medicine

## 2013-12-30 ENCOUNTER — Encounter: Payer: Self-pay | Admitting: Family Medicine

## 2013-12-30 VITALS — BP 115/51 | HR 88 | Wt 157.6 lb

## 2013-12-30 DIAGNOSIS — Z3491 Encounter for supervision of normal pregnancy, unspecified, first trimester: Secondary | ICD-10-CM

## 2013-12-30 NOTE — Progress Notes (Signed)
8 lb weight loss Still with vomiting Techniques for avoiding nausea given New rx for phenergan given last pm F/u 3 wks. Declines first screen and all other genetic testing.

## 2013-12-30 NOTE — Patient Instructions (Signed)
Second Trimester of Pregnancy The second trimester is from week 13 through week 28, months 4 through 6. The second trimester is often a time when you feel your best. Your body has also adjusted to being pregnant, and you begin to feel better physically. Usually, morning sickness has lessened or quit completely, you may have more energy, and you may have an increase in appetite. The second trimester is also a time when the fetus is growing rapidly. At the end of the sixth month, the fetus is about 9 inches long and weighs about 1 pounds. You will likely begin to feel the baby move (quickening) between 18 and 20 weeks of the pregnancy. BODY CHANGES Your body goes through many changes during pregnancy. The changes vary from woman to woman.   Your weight will continue to increase. You will notice your lower abdomen bulging out.  You may begin to get stretch marks on your hips, abdomen, and breasts.  You may develop headaches that can be relieved by medicines approved by your health care provider.  You may urinate more often because the fetus is pressing on your bladder.  You may develop or continue to have heartburn as a result of your pregnancy.  You may develop constipation because certain hormones are causing the muscles that push waste through your intestines to slow down.  You may develop hemorrhoids or swollen, bulging veins (varicose veins).  You may have back pain because of the weight gain and pregnancy hormones relaxing your joints between the bones in your pelvis and as a result of a shift in weight and the muscles that support your balance.  Your breasts will continue to grow and be tender.  Your gums may bleed and may be sensitive to brushing and flossing.  Dark spots or blotches (chloasma, mask of pregnancy) may develop on your face. This will likely fade after the baby is born.  A dark line from your belly button to the pubic area (linea nigra) may appear. This will likely  fade after the baby is born.  You may have changes in your hair. These can include thickening of your hair, rapid growth, and changes in texture. Some women also have hair loss during or after pregnancy, or hair that feels dry or thin. Your hair will most likely return to normal after your baby is born. WHAT TO EXPECT AT YOUR PRENATAL VISITS During a routine prenatal visit:  You will be weighed to make sure you and the fetus are growing normally.  Your blood pressure will be taken.  Your abdomen will be measured to track your baby's growth.  The fetal heartbeat will be listened to.  Any test results from the previous visit will be discussed. Your health care provider may ask you:  How you are feeling.  If you are feeling the baby move.  If you have had any abnormal symptoms, such as leaking fluid, bleeding, severe headaches, or abdominal cramping.  If you have any questions. Other tests that may be performed during your second trimester include:  Blood tests that check for:  Low iron levels (anemia).  Gestational diabetes (between 24 and 28 weeks).  Rh antibodies.  Urine tests to check for infections, diabetes, or protein in the urine.  An ultrasound to confirm the proper growth and development of the baby.  An amniocentesis to check for possible genetic problems.  Fetal screens for spina bifida and Down syndrome. HOME CARE INSTRUCTIONS   Avoid all smoking, herbs, alcohol, and unprescribed   drugs. These chemicals affect the formation and growth of the baby.  Follow your health care provider's instructions regarding medicine use. There are medicines that are either safe or unsafe to take during pregnancy.  Exercise only as directed by your health care provider. Experiencing uterine cramps is a good sign to stop exercising.  Continue to eat regular, healthy meals.  Wear a good support bra for breast tenderness.  Do not use hot tubs, steam rooms, or saunas.  Wear  your seat belt at all times when driving.  Avoid raw meat, uncooked cheese, cat litter boxes, and soil used by cats. These carry germs that can cause birth defects in the baby.  Take your prenatal vitamins.  Try taking a stool softener (if your health care provider approves) if you develop constipation. Eat more high-fiber foods, such as fresh vegetables or fruit and whole grains. Drink plenty of fluids to keep your urine clear or pale yellow.  Take warm sitz baths to soothe any pain or discomfort caused by hemorrhoids. Use hemorrhoid cream if your health care provider approves.  If you develop varicose veins, wear support hose. Elevate your feet for 15 minutes, 3-4 times a day. Limit salt in your diet.  Avoid heavy lifting, wear low heel shoes, and practice good posture.  Rest with your legs elevated if you have leg cramps or low back pain.  Visit your dentist if you have not gone yet during your pregnancy. Use a soft toothbrush to brush your teeth and be gentle when you floss.  A sexual relationship may be continued unless your health care provider directs you otherwise.  Continue to go to all your prenatal visits as directed by your health care provider. SEEK MEDICAL CARE IF:   You have dizziness.  You have mild pelvic cramps, pelvic pressure, or nagging pain in the abdominal area.  You have persistent nausea, vomiting, or diarrhea.  You have a bad smelling vaginal discharge.  You have pain with urination. SEEK IMMEDIATE MEDICAL CARE IF:   You have a fever.  You are leaking fluid from your vagina.  You have spotting or bleeding from your vagina.  You have severe abdominal cramping or pain.  You have rapid weight gain or loss.  You have shortness of breath with chest pain.  You notice sudden or extreme swelling of your face, hands, ankles, feet, or legs.  You have not felt your baby move in over an hour.  You have severe headaches that do not go away with  medicine.  You have vision changes. Document Released: 02/27/2001 Document Revised: 03/10/2013 Document Reviewed: 05/06/2012 [employer] Patient Information 2015 [employer]. This information is not intended to replace advice given to you by your health care provider. Make sure you discuss any questions you have with your health care provider.  Breastfeeding Deciding to breastfeed is one of the best choices you can make for you and your baby. A change in hormones during pregnancy causes your breast tissue to grow and increases the number and size of your milk ducts. These hormones also allow proteins, sugars, and fats from your blood supply to make breast milk in your milk-producing glands. Hormones prevent breast milk from being released before your baby is born as well as prompt milk flow after birth. Once breastfeeding has begun, thoughts of your baby, as well as his or her sucking or crying, can stimulate the release of milk from your milk-producing glands.  BENEFITS OF BREASTFEEDING For Your Baby  Your first   milk (colostrum) helps your baby's digestive system function better.   There are antibodies in your milk that help your baby fight off infections.   Your baby has a lower incidence of asthma, allergies, and sudden infant death syndrome.   The nutrients in breast milk are better for your baby than infant formulas and are designed uniquely for your baby's needs.   Breast milk improves your baby's brain development.   Your baby is less likely to develop other conditions, such as childhood obesity, asthma, or type 2 diabetes mellitus.  For You   Breastfeeding helps to create a very special bond between you and your baby.   Breastfeeding is convenient. Breast milk is always available at the correct temperature and costs nothing.   Breastfeeding helps to burn calories and helps you lose the weight gained during pregnancy.   Breastfeeding makes your uterus contract to its  prepregnancy size faster and slows bleeding (lochia) after you give birth.   Breastfeeding helps to lower your risk of developing type 2 diabetes mellitus, osteoporosis, and breast or ovarian cancer later in life. SIGNS THAT YOUR BABY IS HUNGRY Early Signs of Hunger  Increased alertness or activity.  Stretching.  Movement of the head from side to side.  Movement of the head and opening of the mouth when the corner of the mouth or cheek is stroked (rooting).  Increased sucking sounds, smacking lips, cooing, sighing, or squeaking.  Hand-to-mouth movements.  Increased sucking of fingers or hands. Late Signs of Hunger  Fussing.  Intermittent crying. Extreme Signs of Hunger Signs of extreme hunger will require calming and consoling before your baby will be able to breastfeed successfully. Do not wait for the following signs of extreme hunger to occur before you initiate breastfeeding:   Restlessness.  A loud, strong cry.   Screaming. BREASTFEEDING BASICS Breastfeeding Initiation  Find a comfortable place to sit or lie down, with your neck and back well supported.  Place a pillow or rolled up blanket under your baby to bring him or her to the level of your breast (if you are seated). Nursing pillows are specially designed to help support your arms and your baby while you breastfeed.  Make sure that your baby's abdomen is facing your abdomen.   Gently massage your breast. With your fingertips, massage from your chest wall toward your nipple in a circular motion. This encourages milk flow. You may need to continue this action during the feeding if your milk flows slowly.  Support your breast with 4 fingers underneath and your thumb above your nipple. Make sure your fingers are well away from your nipple and your baby's mouth.   Stroke your baby's lips gently with your finger or nipple.   When your baby's mouth is open wide enough, quickly bring your baby to your breast,  placing your entire nipple and as much of the colored area around your nipple (areola) as possible into your baby's mouth.   More areola should be visible above your baby's upper lip than below the lower lip.   Your baby's tongue should be between his or her lower gum and your breast.   Ensure that your baby's mouth is correctly positioned around your nipple (latched). Your baby's lips should create a seal on your breast and be turned out (everted).  It is common for your baby to suck about 2-3 minutes in order to start the flow of breast milk. Latching Teaching your baby how to latch on to your breast   properly is very important. An improper latch can cause nipple pain and decreased milk supply for you and poor weight gain in your baby. Also, if your baby is not latched onto your nipple properly, he or she may swallow some air during feeding. This can make your baby fussy. Burping your baby when you switch breasts during the feeding can help to get rid of the air. However, teaching your baby to latch on properly is still the best way to prevent fussiness from swallowing air while breastfeeding. Signs that your baby has successfully latched on to your nipple:    Silent tugging or silent sucking, without causing you pain.   Swallowing heard between every 3-4 sucks.    Muscle movement above and in front of his or her ears while sucking.  Signs that your baby has not successfully latched on to nipple:   Sucking sounds or smacking sounds from your baby while breastfeeding.  Nipple pain. If you think your baby has not latched on correctly, slip your finger into the corner of your baby's mouth to break the suction and place it between your baby's gums. Attempt breastfeeding initiation again. Signs of Successful Breastfeeding Signs from your baby:   A gradual decrease in the number of sucks or complete cessation of sucking.   Falling asleep.   Relaxation of his or her body.    Retention of a small amount of milk in his or her mouth.   Letting go of your breast by himself or herself. Signs from you:  Breasts that have increased in firmness, weight, and size 1-3 hours after feeding.   Breasts that are softer immediately after breastfeeding.  Increased milk volume, as well as a change in milk consistency and color by the fifth day of breastfeeding.   Nipples that are not sore, cracked, or bleeding. Signs That Your Baby is Getting Enough Milk  Wetting at least 3 diapers in a 24-hour period. The urine should be clear and pale yellow by age 5 days.  At least 3 stools in a 24-hour period by age 5 days. The stool should be soft and yellow.  At least 3 stools in a 24-hour period by age 7 days. The stool should be seedy and yellow.  No loss of weight greater than 10% of birth weight during the first 3 days of age.  Average weight gain of 4-7 ounces (113-198 g) per week after age 4 days.  Consistent daily weight gain by age 5 days, without weight loss after the age of 2 weeks. After a feeding, your baby may spit up a small amount. This is common. BREASTFEEDING FREQUENCY AND DURATION Frequent feeding will help you make more milk and can prevent sore nipples and breast engorgement. Breastfeed when you feel the need to reduce the fullness of your breasts or when your baby shows signs of hunger. This is called "breastfeeding on demand." Avoid introducing a pacifier to your baby while you are working to establish breastfeeding (the first 4-6 weeks after your baby is born). After this time you may choose to use a pacifier. Research has shown that pacifier use during the first year of a baby's life decreases the risk of sudden infant death syndrome (SIDS). Allow your baby to feed on each breast as long as he or she wants. Breastfeed until your baby is finished feeding. When your baby unlatches or falls asleep while feeding from the first breast, offer the second breast.  Because newborns are often sleepy in the   first few weeks of life, you may need to awaken your baby to get him or her to feed. Breastfeeding times will vary from baby to baby. However, the following rules can serve as a guide to help you ensure that your baby is properly fed:  Newborns (babies 4 weeks of age or younger) may breastfeed every 1-3 hours.  Newborns should not go longer than 3 hours during the day or 5 hours during the night without breastfeeding.  You should breastfeed your baby a minimum of 8 times in a 24-hour period until you begin to introduce solid foods to your baby at around 6 months of age. BREAST MILK PUMPING Pumping and storing breast milk allows you to ensure that your baby is exclusively fed your breast milk, even at times when you are unable to breastfeed. This is especially important if you are going back to work while you are still breastfeeding or when you are not able to be present during feedings. Your lactation consultant can give you guidelines on how long it is safe to store breast milk.  A breast pump is a machine that allows you to pump milk from your breast into a sterile bottle. The pumped breast milk can then be stored in a refrigerator or freezer. Some breast pumps are operated by hand, while others use electricity. Ask your lactation consultant which type will work best for you. Breast pumps can be purchased, but some hospitals and breastfeeding support groups lease breast pumps on a monthly basis. A lactation consultant can teach you how to hand express breast milk, if you prefer not to use a pump.  CARING FOR YOUR BREASTS WHILE YOU BREASTFEED Nipples can become dry, cracked, and sore while breastfeeding. The following recommendations can help keep your breasts moisturized and healthy:  Avoid using soap on your nipples.   Wear a supportive bra. Although not required, special nursing bras and tank tops are designed to allow access to your breasts for  breastfeeding without taking off your entire bra or top. Avoid wearing underwire-style bras or extremely tight bras.  Air dry your nipples for 3-4minutes after each feeding.   Use only cotton bra pads to absorb leaked breast milk. Leaking of breast milk between feedings is normal.   Use lanolin on your nipples after breastfeeding. Lanolin helps to maintain your skin's normal moisture barrier. If you use pure lanolin, you do not need to wash it off before feeding your baby again. Pure lanolin is not toxic to your baby. You may also hand express a few drops of breast milk and gently massage that milk into your nipples and allow the milk to air dry. In the first few weeks after giving birth, some women experience extremely full breasts (engorgement). Engorgement can make your breasts feel heavy, warm, and tender to the touch. Engorgement peaks within 3-5 days after you give birth. The following recommendations can help ease engorgement:  Completely empty your breasts while breastfeeding or pumping. You may want to start by applying warm, moist heat (in the shower or with warm water-soaked hand towels) just before feeding or pumping. This increases circulation and helps the milk flow. If your baby does not completely empty your breasts while breastfeeding, pump any extra milk after he or she is finished.  Wear a snug bra (nursing or regular) or tank top for 1-2 days to signal your body to slightly decrease milk production.  Apply ice packs to your breasts, unless this is too uncomfortable for you.    Make sure that your baby is latched on and positioned properly while breastfeeding. If engorgement persists after 48 hours of following these recommendations, contact your health care provider or a lactation consultant. OVERALL HEALTH CARE RECOMMENDATIONS WHILE BREASTFEEDING  Eat healthy foods. Alternate between meals and snacks, eating 3 of each per day. Because what you eat affects your breast milk,  some of the foods may make your baby more irritable than usual. Avoid eating these foods if you are sure that they are negatively affecting your baby.  Drink milk, fruit juice, and water to satisfy your thirst (about 10 glasses a day).   Rest often, relax, and continue to take your prenatal vitamins to prevent fatigue, stress, and anemia.  Continue breast self-awareness checks.  Avoid chewing and smoking tobacco.  Avoid alcohol and drug use. Some medicines that may be harmful to your baby can pass through breast milk. It is important to ask your health care provider before taking any medicine, including all over-the-counter and prescription medicine as well as vitamin and herbal supplements. It is possible to become pregnant while breastfeeding. If birth control is desired, ask your health care provider about options that will be safe for your baby. SEEK MEDICAL CARE IF:   You feel like you want to stop breastfeeding or have become frustrated with breastfeeding.  You have painful breasts or nipples.  Your nipples are cracked or bleeding.  Your breasts are red, tender, or warm.  You have a swollen area on either breast.  You have a fever or chills.  You have nausea or vomiting.  You have drainage other than breast milk from your nipples.  Your breasts do not become full before feedings by the fifth day after you give birth.  You feel sad and depressed.  Your baby is too sleepy to eat well.  Your baby is having trouble sleeping.   Your baby is wetting less than 3 diapers in a 24-hour period.  Your baby has less than 3 stools in a 24-hour period.  Your baby's skin or the white part of his or her eyes becomes yellow.   Your baby is not gaining weight by 5 days of age. SEEK IMMEDIATE MEDICAL CARE IF:   Your baby is overly tired (lethargic) and does not want to wake up and feed.  Your baby develops an unexplained fever. Document Released: 03/05/2005 Document Revised:  03/10/2013 Document Reviewed: 08/27/2012 [employer] Patient Information 2015 [employer]. This information is not intended to replace advice given to you by your health care provider. Make sure you discuss any questions you have with your health care provider.  

## 2014-01-01 ENCOUNTER — Ambulatory Visit (HOSPITAL_COMMUNITY): Payer: 59

## 2014-01-18 ENCOUNTER — Encounter: Payer: Self-pay | Admitting: Family Medicine

## 2014-01-20 ENCOUNTER — Encounter: Payer: Self-pay | Admitting: Obstetrics & Gynecology

## 2014-01-20 ENCOUNTER — Ambulatory Visit (INDEPENDENT_AMBULATORY_CARE_PROVIDER_SITE_OTHER): Payer: 59 | Admitting: Obstetrics & Gynecology

## 2014-01-20 VITALS — BP 110/73 | HR 97 | Wt 153.0 lb

## 2014-01-20 DIAGNOSIS — Z3490 Encounter for supervision of normal pregnancy, unspecified, unspecified trimester: Secondary | ICD-10-CM

## 2014-01-20 NOTE — Progress Notes (Signed)
Routine visit. Nausea improving. She got her flu vaccine at work ([employer] Still declines genetic testing. Schedule anatomy us for 19 weeks.

## 2014-02-22 ENCOUNTER — Ambulatory Visit (HOSPITAL_COMMUNITY)
Admission: RE | Admit: 2014-02-22 | Discharge: 2014-02-22 | Disposition: A | Discharge status: Home / Self Care | Payer: 59 | Source: Ambulatory Visit | Attending: Obstetrics & Gynecology | Admitting: Obstetrics & Gynecology

## 2014-02-22 ENCOUNTER — Other Ambulatory Visit: Payer: Self-pay | Admitting: Obstetrics & Gynecology

## 2014-02-22 DIAGNOSIS — Z36 Encounter for antenatal screening of mother: Secondary | ICD-10-CM | POA: Diagnosis present

## 2014-02-22 DIAGNOSIS — Z3A19 19 weeks gestation of pregnancy: Secondary | ICD-10-CM | POA: Diagnosis not present

## 2014-02-22 DIAGNOSIS — Z3490 Encounter for supervision of normal pregnancy, unspecified, unspecified trimester: Secondary | ICD-10-CM

## 2014-02-23 DIAGNOSIS — Z3689 Encounter for other specified antenatal screening: Secondary | ICD-10-CM | POA: Insufficient documentation

## 2014-02-23 DIAGNOSIS — Z3A19 19 weeks gestation of pregnancy: Secondary | ICD-10-CM | POA: Insufficient documentation

## 2014-02-24 ENCOUNTER — Ambulatory Visit (INDEPENDENT_AMBULATORY_CARE_PROVIDER_SITE_OTHER): Payer: 59 | Admitting: Obstetrics & Gynecology

## 2014-02-24 VITALS — BP 113/77 | HR 90 | Wt 154.0 lb

## 2014-02-24 DIAGNOSIS — O0932 Supervision of pregnancy with insufficient antenatal care, second trimester: Secondary | ICD-10-CM

## 2014-02-24 DIAGNOSIS — Z3492 Encounter for supervision of normal pregnancy, unspecified, second trimester: Secondary | ICD-10-CM

## 2014-02-24 NOTE — Progress Notes (Signed)
Pt with no complaints.  Declines genetic testing.

## 2014-02-24 NOTE — Patient Instructions (Signed)
Second Trimester of Pregnancy The second trimester is from week 13 through week 28, months 4 through 6. The second trimester is often a time when you feel your best. Your body has also adjusted to being pregnant, and you begin to feel better physically. Usually, morning sickness has lessened or quit completely, you may have more energy, and you may have an increase in appetite. The second trimester is also a time when the fetus is growing rapidly. At the end of the sixth month, the fetus is about 9 inches long and weighs about 1 pounds. You will likely begin to feel the baby move (quickening) between 18 and 20 weeks of the pregnancy. BODY CHANGES Your body goes through many changes during pregnancy. The changes vary from woman to woman.   Your weight will continue to increase. You will notice your lower abdomen bulging out.  You may begin to get stretch marks on your hips, abdomen, and breasts.  You may develop headaches that can be relieved by medicines approved by your health care provider.  You may urinate more often because the fetus is pressing on your bladder.  You may develop or continue to have heartburn as a result of your pregnancy.  You may develop constipation because certain hormones are causing the muscles that push waste through your intestines to slow down.  You may develop hemorrhoids or swollen, bulging veins (varicose veins).  You may have back pain because of the weight gain and pregnancy hormones relaxing your joints between the bones in your pelvis and as a result of a shift in weight and the muscles that support your balance.  Your breasts will continue to grow and be tender.  Your gums may bleed and may be sensitive to brushing and flossing.  Dark spots or blotches (chloasma, mask of pregnancy) may develop on your face. This will likely fade after the baby is born.  A dark line from your belly button to the pubic area (linea nigra) may appear. This will likely fade  after the baby is born.  You may have changes in your hair. These can include thickening of your hair, rapid growth, and changes in texture. Some women also have hair loss during or after pregnancy, or hair that feels dry or thin. Your hair will most likely return to normal after your baby is born. WHAT TO EXPECT AT YOUR PRENATAL VISITS During a routine prenatal visit:  You will be weighed to make sure you and the fetus are growing normally.  Your blood pressure will be taken.  Your abdomen will be measured to track your baby's growth.  The fetal heartbeat will be listened to.  Any test results from the previous visit will be discussed. Your health care provider may ask you:  How you are feeling.  If you are feeling the baby move.  If you have had any abnormal symptoms, such as leaking fluid, bleeding, severe headaches, or abdominal cramping.  If you have any questions. Other tests that may be performed during your second trimester include:  Blood tests that check for:  Low iron levels (anemia).  Gestational diabetes (between 24 and 28 weeks).  Rh antibodies.  Urine tests to check for infections, diabetes, or protein in the urine.  An ultrasound to confirm the proper growth and development of the baby.  An amniocentesis to check for possible genetic problems.  Fetal screens for spina bifida and Down syndrome. HOME CARE INSTRUCTIONS   Avoid all smoking, herbs, alcohol, and unprescribed   drugs. These chemicals affect the formation and growth of the baby.  Follow your health care provider's instructions regarding medicine use. There are medicines that are either safe or unsafe to take during pregnancy.  Exercise only as directed by your health care provider. Experiencing uterine cramps is a good sign to stop exercising.  Continue to eat regular, healthy meals.  Wear a good support bra for breast tenderness.  Do not use hot tubs, steam rooms, or saunas.  Wear your  seat belt at all times when driving.  Avoid raw meat, uncooked cheese, cat litter boxes, and soil used by cats. These carry germs that can cause birth defects in the baby.  Take your prenatal vitamins.  Try taking a stool softener (if your health care provider approves) if you develop constipation. Eat more high-fiber foods, such as fresh vegetables or fruit and whole grains. Drink plenty of fluids to keep your urine clear or pale yellow.  Take warm sitz baths to soothe any pain or discomfort caused by hemorrhoids. Use hemorrhoid cream if your health care provider approves.  If you develop varicose veins, wear support hose. Elevate your feet for 15 minutes, 3-4 times a day. Limit salt in your diet.  Avoid heavy lifting, wear low heel shoes, and practice good posture.  Rest with your legs elevated if you have leg cramps or low back pain.  Visit your dentist if you have not gone yet during your pregnancy. Use a soft toothbrush to brush your teeth and be gentle when you floss.  A sexual relationship may be continued unless your health care provider directs you otherwise.  Continue to go to all your prenatal visits as directed by your health care provider. SEEK MEDICAL CARE IF:   You have dizziness.  You have mild pelvic cramps, pelvic pressure, or nagging pain in the abdominal area.  You have persistent nausea, vomiting, or diarrhea.  You have a bad smelling vaginal discharge.  You have pain with urination. SEEK IMMEDIATE MEDICAL CARE IF:   You have a fever.  You are leaking fluid from your vagina.  You have spotting or bleeding from your vagina.  You have severe abdominal cramping or pain.  You have rapid weight gain or loss.  You have shortness of breath with chest pain.  You notice sudden or extreme swelling of your face, hands, ankles, feet, or legs.  You have not felt your baby move in over an hour.  You have severe headaches that do not go away with  medicine.  You have vision changes. Document Released: 02/27/2001 Document Revised: 03/10/2013 Document Reviewed: 05/06/2012 [employer] Patient Information 2015 [employer]. This information is not intended to replace advice given to you by your health care provider. Make sure you discuss any questions you have with your health care provider.  

## 2014-03-08 ENCOUNTER — Inpatient Hospital Stay (HOSPITAL_COMMUNITY)
Admission: AD | Admit: 2014-03-08 | Discharge: 2014-03-08 | Disposition: A | Discharge status: Home / Self Care | Payer: 59 | Source: Ambulatory Visit | Attending: Obstetrics & Gynecology | Admitting: Obstetrics & Gynecology

## 2014-03-08 ENCOUNTER — Encounter (HOSPITAL_COMMUNITY): Payer: Self-pay | Admitting: *Deleted

## 2014-03-08 DIAGNOSIS — Z3A21 21 weeks gestation of pregnancy: Secondary | ICD-10-CM | POA: Diagnosis not present

## 2014-03-08 DIAGNOSIS — O99332 Smoking (tobacco) complicating pregnancy, second trimester: Secondary | ICD-10-CM | POA: Insufficient documentation

## 2014-03-08 DIAGNOSIS — O26892 Other specified pregnancy related conditions, second trimester: Secondary | ICD-10-CM | POA: Diagnosis not present

## 2014-03-08 DIAGNOSIS — F17210 Nicotine dependence, cigarettes, uncomplicated: Secondary | ICD-10-CM | POA: Insufficient documentation

## 2014-03-08 DIAGNOSIS — M549 Dorsalgia, unspecified: Secondary | ICD-10-CM | POA: Diagnosis not present

## 2014-03-08 DIAGNOSIS — O99891 Other specified diseases and conditions complicating pregnancy: Secondary | ICD-10-CM

## 2014-03-08 DIAGNOSIS — M545 Low back pain: Secondary | ICD-10-CM | POA: Diagnosis present

## 2014-03-08 DIAGNOSIS — O9989 Other specified diseases and conditions complicating pregnancy, childbirth and the puerperium: Secondary | ICD-10-CM

## 2014-03-08 LAB — URINALYSIS, ROUTINE W REFLEX MICROSCOPIC
Bilirubin Urine: NEGATIVE
GLUCOSE, UA: NEGATIVE mg/dL
Hgb urine dipstick: NEGATIVE
Ketones, ur: NEGATIVE mg/dL
Nitrite: NEGATIVE
PH: 6 (ref 5.0–8.0)
Protein, ur: NEGATIVE mg/dL
Specific Gravity, Urine: 1.02 (ref 1.005–1.030)
Urobilinogen, UA: 0.2 mg/dL (ref 0.0–1.0)

## 2014-03-08 LAB — URINE MICROSCOPIC-ADD ON

## 2014-03-08 NOTE — MAU Note (Signed)
Pt denies all pain now.  States she feels just fine; was concerned because at work she had a sudden onset of back pain, she did not take anything for the pain and it went away.

## 2014-03-08 NOTE — MAU Note (Signed)
Mid back pain that radiates into sides started around 1530, was severe, has eased off some now.  Denies abd pain or bleeding.  Reports FM.

## 2014-03-08 NOTE — Discharge Instructions (Signed)
Back Injury Prevention Back injuries can be extremely painful and difficult to heal. After having one back injury, you are much more likely to experience another later on. It is important to learn how to avoid injuring or re-injuring your back. The following tips can help you to prevent a back injury. PHYSICAL FITNESS  Exercise regularly and try to develop good tone in your abdominal muscles. Your abdominal muscles provide a lot of the support needed by your back.  Do aerobic exercises (walking, jogging, biking, swimming) regularly.  Do exercises that increase balance and strength (tai chi, yoga) regularly. This can decrease your risk of falling and injuring your back.  Stretch before and after exercising.  Maintain a healthy weight. The more you weigh, the more stress is placed on your back. For every pound of weight, 10 times that amount of pressure is placed on the back. DIET  Talk to your caregiver about how much calcium and vitamin D you need per day. These nutrients help to prevent weakening of the bones (osteoporosis). Osteoporosis can cause broken (fractured) bones that lead to back pain.  Include good sources of calcium in your diet, such as dairy products, [place], leafy vegetables, and products with calcium added (fortified).  Include good sources of vitamin D in your diet, such as milk and foods that are fortified with vitamin D.  Consider taking a nutritional supplement or a multivitamin if needed.  Stop smoking if you smoke. POSTURE  Sit and stand up straight. Avoid leaning forward when you sit or hunching over when you stand.  Choose chairs with good low back (lumbar) support.  If you work at a desk, sit close to your work so you do not need to lean over. Keep your chin tucked in. Keep your neck drawn back and elbows bent at a right angle. Your arms should look like the letter "L."  Sit high and close to the steering wheel when you drive. Add a lumbar support to your car  seat if needed.  Avoid sitting or standing in one position for too long. Take breaks to get up, stretch, and walk around at least once every hour. Take breaks if you are driving for long periods of time.  Sleep on your side with your knees slightly bent, or sleep on your back with a pillow under your knees. Do not sleep on your stomach. LIFTING, TWISTING, AND REACHING  Avoid heavy lifting, especially repetitive lifting. If you must do heavy lifting:  Stretch before lifting.  Work slowly.  Rest between lifts.  Use carts and dollies to move objects when possible.  Make several small trips instead of carrying 1 heavy load.  Ask for help when you need it.  Ask for help when moving big, awkward objects.  Follow these steps when lifting:  Stand with your feet shoulder-width apart.  Get as close to the object as you can. Do not try to pick up heavy objects that are far from your body.  Use handles or lifting straps if they are available.  Bend at your knees. Squat down, but keep your heels off the floor.  Keep your shoulders pulled back, your chin tucked in, and your back straight.  Lift the object slowly, tightening the muscles in your legs, abdomen, and buttocks. Keep the object as close to the center of your body as possible.  When you put a load down, use these same guidelines in reverse.  Do not:  Lift the object above your waist.  Twist at the waist while lifting or carrying a load. Move your feet if you need to turn, not your waist.  Bend over without bending at your knees.  Avoid reaching over your head, across a table, or for an object on a high surface. OTHER TIPS  Avoid wet floors and keep sidewalks clear of ice to prevent falls.  Do not sleep on a mattress that is too soft or too hard.  Keep items that are used frequently within easy reach.  Put heavier objects on shelves at waist level and lighter objects on lower or higher shelves.  Find ways to  decrease your stress, such as exercise, massage, or relaxation techniques. Stress can build up in your muscles. Tense muscles are more vulnerable to injury.  Seek treatment for depression or anxiety if needed. These conditions can increase your risk of developing back pain. SEEK MEDICAL CARE IF:  You injure your back.  You have questions about diet, exercise, or other ways to prevent back injuries. MAKE SURE YOU:  Understand these instructions.  Will watch your condition.  Will get help right away if you are not doing well or get worse. Document Released: 04/12/2004 Document Revised: 05/28/2011 Document Reviewed: 04/16/2011 [facility] Patient Information 2015 [place], [place]. This information is not intended to replace advice given to you by your health care provider. Make sure you discuss any questions you have with your health care provider.  Abdominal Pain During Pregnancy Belly (abdominal) pain is common during pregnancy. Most of the time, it is not a serious problem. Other times, it can be a sign that something is wrong with the pregnancy. Always tell your doctor if you have belly pain. HOME CARE Monitor your belly pain for any changes. The following actions may help you feel better:  Do not have sex (intercourse) or put anything in your vagina until you feel better.  Rest until your pain stops.  Drink clear fluids if you feel sick to your stomach (nauseous). Do not eat solid food until you feel better.  Only take medicine as told by your doctor.  Keep all doctor visits as told. GET HELP RIGHT AWAY IF:   You are bleeding, leaking fluid, or pieces of tissue come out of your vagina.  You have more pain or cramping.  You keep throwing up (vomiting).  You have pain when you pee (urinate) or have blood in your pee.  You have a fever.  You do not feel your baby moving as much.  You feel very weak or feel like passing out.  You have trouble breathing, with or without belly  pain.  You have a very bad headache and belly pain.  You have fluid leaking from your vagina and belly pain.  You keep having watery poop (diarrhea).  Your belly pain does not go away after resting, or the pain gets worse. MAKE SURE YOU:   Understand these instructions.  Will watch your condition.  Will get help right away if you are not doing well or get worse. Document Released: 02/21/2009 Document Revised: 11/05/2012 Document Reviewed: 10/02/2012 [facility] Patient Information 2015 [place], [place]. This information is not intended to replace advice given to you by your health care provider. Make sure you discuss any questions you have with your health care provider.

## 2014-03-08 NOTE — MAU Provider Note (Signed)
Chief Complaint:  Back Pain   First Provider Initiated Contact with Patient 03/08/14 2053      HPI: Catherine HaffMichelle Ruiz is a 23 y.o. G2P0010 at 4342w5d who presents with concern that she had sudden onset of sharp bilateral mid and low LBP while sitting at work earlier today. Pain radiated to left and right groin.  Denies strain or relation to activity. Tried walklng  Pain resolved spontaneously and has not recurred. Denies dysuria, urgency frequency. Denies irritative vaginal discharge.  Denies contractions, leakage of fluid or vaginal bleeding. Good fetal movement.   [facility] Course: [facility]; smoker  Past Medical History: Past Medical History  Diagnosis Date  . Ruptured spleen   . Miscarriage   . GERD (gastroesophageal reflux disease)     Past obstetric history: OB History  Gravida Para Term Preterm AB SAB TAB Ectopic Multiple Living  2    1 1     0    # Outcome Date GA Lbr Len/2nd Weight Sex Delivery Anes PTL Lv  2 Current           1 SAB               Past Surgical History: Past Surgical History  Procedure Laterality Date  . Appendectomy    . Mouth surgery       Family History: Family History  Problem Relation Age of Onset  . Asthma Sister   . Asthma Brother     Social History: History  Substance Use Topics  . Smoking status: Current Every Day Smoker -- 1.00 packs/day for 2 years  . Smokeless tobacco: Current User     Comment: cutting back/trying to quit  . Alcohol Use: No    Allergies:  Allergies  Allergen Reactions  . Aspirin Nausea Only  . Keflex [Cephalexin] Nausea And Vomiting  . Penicillins Nausea And Vomiting  . Sulfa Antibiotics Rash    Meds:  Prescriptions prior to admission  Medication Sig Dispense Refill Last Dose  . Prenatal Multivit-Min-Fe-FA (PRENATAL VITAMINS PO) Take 1 tablet by mouth daily.    03/08/2014 at Unknown time  . promethazine (PHENERGAN) 25 MG tablet Take 0.5 tablets (12.5 mg total) by mouth every 6 (six) hours as  needed. 20 tablet 0 Past Week at Unknown time  . ranitidine (ZANTAC) 150 MG tablet Take 150 mg by mouth 2 (two) times daily as needed for heartburn.    03/07/2014 at Unknown time    ROS: Pertinent findings in history of present illness.  Physical Exam  Blood pressure 112/64, pulse 104, temperature 98.7 F (37.1 C), temperature source Oral, resp. rate 18, last menstrual period 10/07/2013. GENERAL: Well-developed, well-nourished female in no acute distress.  HEENT: normocephalic HEART: normal rate RESP: normal effort ABDOMEN: Soft, non-tender, gravid appropriate for gestational [place], [place] [address] BACK: Neg CVAT, NT to palpation along spine EXTREMITIES: Nontender, no edema NEURO: alert and oriented    Dilation: Closed Effacement (%): Thick Cervical Position: Posterior Exam by:: D. Authur Cubit CNM   Labs: Results for orders placed or performed during the hospital encounter of 03/08/14 (from the past 24 hour(s))  Urinalysis, Routine w reflex microscopic     Status: Abnormal   Collection Time: 03/08/14  6:53 PM  Result Value Ref Range   Color, Urine YELLOW YELLOW   APPearance CLEAR CLEAR   Specific Gravity, Urine 1.020 1.005 - 1.030   pH 6.0 5.0 - 8.0   Glucose, UA NEGATIVE NEGATIVE mg/dL   Hgb urine dipstick NEGATIVE NEGATIVE  Bilirubin Urine NEGATIVE NEGATIVE   Ketones, ur NEGATIVE NEGATIVE mg/dL   Protein, ur NEGATIVE NEGATIVE mg/dL   Urobilinogen, UA 0.2 0.0 - 1.0 mg/dL   Nitrite NEGATIVE NEGATIVE   Leukocytes, UA SMALL (A) NEGATIVE  Urine microscopic-add on     Status: Abnormal   Collection Time: 03/08/14  6:53 PM  Result Value Ref Range   Squamous Epithelial / LPF RARE RARE   WBC, UA 3-6 <3 WBC/hpf   RBC / HPF 0-2 <3 RBC/hpf   Bacteria, UA FEW (A) RARE    Imaging:  [place] Ob Comp + 14 Wk  02/23/2014   OBSTETRICAL ULTRASOUND: This exam was performed within a [place] [facility]. The OB US report was generated in the AS system, and faxed to the ordering physician.   This  report is available in the [facility] [employer] PACS. See the [facility] Obstetric US report via the Image Link.  MAU Course: Urine culture sent  Assessment: 1. Back pain affecting pregnancy in second trimester   Musculoskeletal pain , resolved G2P0010 at 413w5d  Plan: Discharge home with reassurance and  back precautions     Medication List    TAKE these medications        PRENATAL VITAMINS PO  Take 1 tablet by mouth daily.     promethazine 25 MG tablet  Commonly known as:  PHENERGAN  Take 0.5 tablets (12.5 mg total) by mouth every 6 (six) hours as needed.     ranitidine 150 MG tablet  Commonly known as:  ZANTAC  Take 150 mg by mouth 2 (two) times daily as needed for heartburn.       Follow-up Information    [facility].   Specialty:  Obstetrics and Gynecology   Why:  Keep your scheduled prenatal appointment   Contact information:   [address] [place] [id number] [phone]      [name] OrleansDeirdre C Haeven Nickle, CNM 03/08/2014 8:54 PM

## 2014-03-10 LAB — URINE CULTURE
Colony Count: NO GROWTH
Culture: NO GROWTH
Special Requests: NORMAL

## 2014-03-19 NOTE — L&D Delivery Note (Signed)
I did not deliver this patient and was not present for delivery, this is a delivery summary for Catherine Ruiz.  Patient is 24 y.o. [id number] 8941w4d admitted in active labor, GBS positive started on PCN for ppx, no true PCN allergy as she reported PCN allergy was throwing up   Delivery Note At 10:52 PM a viable female was delivered via Vaginal, Vacuum (Extractor) (Presentation: ; Occiput Anterior).  APGAR: 9, 9; weight 7 lb 13 oz (3545 g).   Placenta status: Intact, Spontaneous.  Cord: 3 vessels with the following complications: None.    Anesthesia: Epidural  Episiotomy: None Lacerations: None Suture Repair: n/a Est. Blood Loss (mL): 139  Mom to postpartum.  Baby to Couplet care / Skin to Skin.

## 2014-03-25 ENCOUNTER — Ambulatory Visit (INDEPENDENT_AMBULATORY_CARE_PROVIDER_SITE_OTHER): Payer: 59 | Admitting: Obstetrics & Gynecology

## 2014-03-25 ENCOUNTER — Encounter: Payer: Self-pay | Admitting: Obstetrics & Gynecology

## 2014-03-25 VITALS — BP 119/63 | HR 79 | Wt 158.0 lb

## 2014-03-25 DIAGNOSIS — Z3490 Encounter for supervision of normal pregnancy, unspecified, unspecified trimester: Secondary | ICD-10-CM

## 2014-03-25 NOTE — Progress Notes (Signed)
Routine visit. Good FM. No problems. Anatomy u/s follow up ordered. Glucola, labs, tdap at next visit.

## 2014-03-30 ENCOUNTER — Ambulatory Visit (HOSPITAL_COMMUNITY)
Admission: RE | Admit: 2014-03-30 | Discharge: 2014-03-30 | Disposition: A | Discharge status: Home / Self Care | Payer: 59 | Source: Ambulatory Visit | Attending: Obstetrics & Gynecology | Admitting: Obstetrics & Gynecology

## 2014-03-30 DIAGNOSIS — Z36 Encounter for antenatal screening of mother: Secondary | ICD-10-CM | POA: Insufficient documentation

## 2014-03-30 DIAGNOSIS — Z0489 Encounter for examination and observation for other specified reasons: Secondary | ICD-10-CM | POA: Insufficient documentation

## 2014-03-30 DIAGNOSIS — Z3A24 24 weeks gestation of pregnancy: Secondary | ICD-10-CM | POA: Insufficient documentation

## 2014-03-30 DIAGNOSIS — Z3490 Encounter for supervision of normal pregnancy, unspecified, unspecified trimester: Secondary | ICD-10-CM

## 2014-03-30 DIAGNOSIS — IMO0002 Reserved for concepts with insufficient information to code with codable children: Secondary | ICD-10-CM | POA: Insufficient documentation

## 2014-04-04 ENCOUNTER — Emergency Department: Payer: Self-pay | Admitting: Emergency Medicine

## 2014-04-20 ENCOUNTER — Encounter (HOSPITAL_COMMUNITY): Payer: Self-pay

## 2014-04-22 ENCOUNTER — Encounter: Payer: Self-pay | Admitting: Obstetrics and Gynecology

## 2014-04-22 ENCOUNTER — Ambulatory Visit (INDEPENDENT_AMBULATORY_CARE_PROVIDER_SITE_OTHER): Payer: 59 | Admitting: Obstetrics and Gynecology

## 2014-04-22 VITALS — BP 115/83 | HR 120 | Wt 164.0 lb

## 2014-04-22 DIAGNOSIS — Z36 Encounter for antenatal screening of mother: Secondary | ICD-10-CM

## 2014-04-22 DIAGNOSIS — Z3483 Encounter for supervision of other normal pregnancy, third trimester: Secondary | ICD-10-CM

## 2014-04-22 DIAGNOSIS — Z23 Encounter for immunization: Secondary | ICD-10-CM

## 2014-04-22 DIAGNOSIS — Z3493 Encounter for supervision of normal pregnancy, unspecified, third trimester: Secondary | ICD-10-CM

## 2014-04-22 DIAGNOSIS — Z720 Tobacco use: Secondary | ICD-10-CM

## 2014-04-22 NOTE — Patient Instructions (Signed)
Contraception Choices Contraception (birth control) is the use of any methods or devices to prevent pregnancy. Below are some methods to help avoid pregnancy. HORMONAL METHODS   Contraceptive implant. This is a thin, plastic tube containing progesterone hormone. It does not contain estrogen hormone. Your health care provider inserts the tube in the inner part of the upper arm. The tube can remain in place for up to 3 years. After 3 years, the implant must be removed. The implant prevents the ovaries from releasing an egg (ovulation), thickens the cervical mucus to prevent sperm from entering the uterus, and thins the lining of the inside of the uterus.  Progesterone-only injections. These injections are given every 3 months by your health care provider to prevent pregnancy. This synthetic progesterone hormone stops the ovaries from releasing eggs. It also thickens cervical mucus and changes the uterine lining. This makes it harder for sperm to survive in the uterus.  Birth control pills. These pills contain estrogen and progesterone hormone. They work by preventing the ovaries from releasing eggs (ovulation). They also cause the cervical mucus to thicken, preventing the sperm from entering the uterus. Birth control pills are prescribed by a health care provider.Birth control pills can also be used to treat heavy periods.  Minipill. This type of birth control pill contains only the progesterone hormone. They are taken every day of each month and must be prescribed by your health care provider.  Birth control patch. The patch contains hormones similar to those in birth control pills. It must be changed once a week and is prescribed by a health care provider.  Vaginal ring. The ring contains hormones similar to those in birth control pills. It is left in the vagina for 3 weeks, removed for 1 week, and then a new one is put back in place. The patient must be comfortable inserting and removing the ring  from the vagina.A health care provider's prescription is necessary.  Emergency contraception. Emergency contraceptives prevent pregnancy after unprotected sexual intercourse. This pill can be taken right after sex or up to 5 days after unprotected sex. It is most effective the sooner you take the pills after having sexual intercourse. Most emergency contraceptive pills are available without a prescription. Check with your pharmacist. Do not use emergency contraception as your only form of birth control. BARRIER METHODS   Female condom. This is a thin sheath (latex or rubber) that is worn over the penis during sexual intercourse. It can be used with spermicide to increase effectiveness.  Female condom. This is a soft, loose-fitting sheath that is put into the vagina before sexual intercourse.  Diaphragm. This is a soft, latex, dome-shaped barrier that must be fitted by a health care provider. It is inserted into the vagina, along with a spermicidal jelly. It is inserted before intercourse. The diaphragm should be left in the vagina for 6 to 8 hours after intercourse.  Cervical cap. This is a round, soft, latex or plastic cup that fits over the cervix and must be fitted by a health care provider. The cap can be left in place for up to 48 hours after intercourse.  Sponge. This is a soft, circular piece of polyurethane foam. The sponge has spermicide in it. It is inserted into the vagina after wetting it and before sexual intercourse.  Spermicides. These are chemicals that kill or block sperm from entering the cervix and uterus. They come in the form of creams, jellies, suppositories, foam, or tablets. They do not require a   prescription. They are inserted into the vagina with an applicator before having sexual intercourse. The process must be repeated every time you have sexual intercourse. INTRAUTERINE CONTRACEPTION  Intrauterine device (IUD). This is a T-shaped device that is put in a woman's uterus  during a menstrual period to prevent pregnancy. There are 2 types:  Copper IUD. This type of IUD is wrapped in copper wire and is placed inside the uterus. Copper makes the uterus and fallopian tubes produce a fluid that kills sperm. It can stay in place for 10 years.  Hormone IUD. This type of IUD contains the hormone progestin (synthetic progesterone). The hormone thickens the cervical mucus and prevents sperm from entering the uterus, and it also thins the uterine lining to prevent implantation of a fertilized egg. The hormone can weaken or kill the sperm that get into the uterus. It can stay in place for 3-5 years, depending on which type of IUD is used. PERMANENT METHODS OF CONTRACEPTION  Female tubal ligation. This is when the woman's fallopian tubes are surgically sealed, tied, or blocked to prevent the egg from traveling to the uterus.  Hysteroscopic sterilization. This involves placing a small coil or insert into each fallopian tube. Your doctor uses a technique called hysteroscopy to do the procedure. The device causes scar tissue to form. This results in permanent blockage of the fallopian tubes, so the sperm cannot fertilize the egg. It takes about 3 months after the procedure for the tubes to become blocked. You must use another form of birth control for these 3 months.  Female sterilization. This is when the female has the tubes that carry sperm tied off (vasectomy).This blocks sperm from entering the vagina during sexual intercourse. After the procedure, the man can still ejaculate fluid (semen). NATURAL PLANNING METHODS  Natural family planning. This is not having sexual intercourse or using a barrier method (condom, diaphragm, cervical cap) on days the woman could become pregnant.  Calendar method. This is keeping track of the length of each menstrual cycle and identifying when you are fertile.  Ovulation method. This is avoiding sexual intercourse during ovulation.  Symptothermal  method. This is avoiding sexual intercourse during ovulation, using a thermometer and ovulation symptoms.  Post-ovulation method. This is timing sexual intercourse after you have ovulated. Regardless of which type or method of contraception you choose, it is important that you use condoms to protect against the transmission of sexually transmitted infections (STIs). Talk with your health care provider about which form of contraception is most appropriate for you. Document Released: 03/05/2005 Document Revised: 03/10/2013 Document Reviewed: 08/28/2012 [employer] Patient Information 2015 [employer]. This information is not intended to replace advice given to you by your health care provider. Make sure you discuss any questions you have with your health care provider.  

## 2014-04-22 NOTE — Progress Notes (Signed)
Patient is doing well without complaints. 1hr GCT and labs today. Patient remains undecided regarding contraception-information provided

## 2014-04-23 LAB — CBC
HCT: 31.2 % — ABNORMAL LOW (ref 36.0–46.0)
Hemoglobin: 10.4 g/dL — ABNORMAL LOW (ref 12.0–15.0)
MCH: 30.6 pg (ref 26.0–34.0)
MCHC: 33.3 g/dL (ref 30.0–36.0)
MCV: 91.8 fL (ref 78.0–100.0)
MPV: 10.8 fL (ref 8.6–12.4)
Platelets: 275 10*3/uL (ref 150–400)
RBC: 3.4 MIL/uL — ABNORMAL LOW (ref 3.87–5.11)
RDW: 13.8 % (ref 11.5–15.5)
WBC: 10.8 10*3/uL — AB (ref 4.0–10.5)

## 2014-04-23 LAB — HIV ANTIBODY (ROUTINE TESTING W REFLEX): HIV 1&2 Ab, 4th Generation: NONREACTIVE

## 2014-04-23 LAB — GLUCOSE TOLERANCE, 1 HOUR (50G) W/O FASTING: Glucose, 1 Hour GTT: 72 mg/dL (ref 70–140)

## 2014-04-23 LAB — RPR

## 2014-04-28 ENCOUNTER — Encounter (HOSPITAL_COMMUNITY): Payer: Self-pay

## 2014-05-06 ENCOUNTER — Ambulatory Visit (INDEPENDENT_AMBULATORY_CARE_PROVIDER_SITE_OTHER): Payer: 59 | Admitting: Obstetrics & Gynecology

## 2014-05-06 VITALS — BP 131/82 | HR 109 | Wt 167.0 lb

## 2014-05-06 DIAGNOSIS — Z3493 Encounter for supervision of normal pregnancy, unspecified, third trimester: Secondary | ICD-10-CM

## 2014-05-06 NOTE — Progress Notes (Signed)
Patient reports occasional episodes of dizziness and elevated HR, but this is alleviated by lying down and resting. No CP or SOB.  Will continue to monitor, if episodes get more frequent may need further analysis.  Recent Hgb was 10.4, suggested OTC iron supplements and possible OTC remedies for associated constipation Declines any postpartum contraception,  "I will use my own". No other complaints or concerns.  Labor and fetal movement precautions reviewed.

## 2014-05-06 NOTE — Patient Instructions (Signed)
Return to clinic for any obstetric concerns or go to [facility] for evaluation  

## 2014-05-19 ENCOUNTER — Ambulatory Visit (INDEPENDENT_AMBULATORY_CARE_PROVIDER_SITE_OTHER): Payer: 59 | Admitting: Obstetrics and Gynecology

## 2014-05-19 ENCOUNTER — Encounter: Payer: Self-pay | Admitting: Obstetrics and Gynecology

## 2014-05-19 VITALS — BP 126/87 | HR 115 | Wt 169.0 lb

## 2014-05-19 DIAGNOSIS — Z720 Tobacco use: Secondary | ICD-10-CM

## 2014-05-19 DIAGNOSIS — Z3493 Encounter for supervision of normal pregnancy, unspecified, third trimester: Secondary | ICD-10-CM

## 2014-05-19 NOTE — Progress Notes (Signed)
Patient is doing well without complaints. Still with occasional dizzy spells but no worst than before. FM/PTL precautions reviewed. Patient did not start iron supplements but plans on doing so

## 2014-05-20 ENCOUNTER — Encounter: Payer: 59 | Admitting: Family Medicine

## 2014-06-02 ENCOUNTER — Ambulatory Visit (INDEPENDENT_AMBULATORY_CARE_PROVIDER_SITE_OTHER): Payer: 59 | Admitting: Family Medicine

## 2014-06-02 VITALS — BP 113/73 | HR 101 | Wt 170.0 lb

## 2014-06-02 DIAGNOSIS — Z3493 Encounter for supervision of normal pregnancy, unspecified, third trimester: Secondary | ICD-10-CM

## 2014-06-02 NOTE — Progress Notes (Signed)
Doing well Labor precautions reviewed Cultures next visit.

## 2014-06-02 NOTE — Patient Instructions (Signed)
Third Trimester of Pregnancy The third trimester is from week 29 through week 42, months 7 through 9. The third trimester is a time when the fetus is growing rapidly. At the end of the ninth month, the fetus is about 20 inches in length and weighs 6-10 pounds.  BODY CHANGES Your body goes through many changes during pregnancy. The changes vary from woman to woman.   Your weight will continue to increase. You can expect to gain 25-35 pounds (11-16 kg) by the end of the pregnancy.  You may begin to get stretch marks on your hips, abdomen, and breasts.  You may urinate more often because the fetus is moving lower into your pelvis and pressing on your bladder.  You may develop or continue to have heartburn as a result of your pregnancy.  You may develop constipation because certain hormones are causing the muscles that push waste through your intestines to slow down.  You may develop hemorrhoids or swollen, bulging veins (varicose veins).  You may have pelvic pain because of the weight gain and pregnancy hormones relaxing your joints between the bones in your pelvis. Backaches may result from overexertion of the muscles supporting your posture.  You may have changes in your hair. These can include thickening of your hair, rapid growth, and changes in texture. Some women also have hair loss during or after pregnancy, or hair that feels dry or thin. Your hair will most likely return to normal after your baby is born.  Your breasts will continue to grow and be tender. A yellow discharge may leak from your breasts called colostrum.  Your belly button may stick out.  You may feel short of breath because of your expanding uterus.  You may notice the fetus "dropping," or moving lower in your abdomen.  You may have a bloody mucus discharge. This usually occurs a few days to a week before labor begins.  Your cervix becomes thin and soft (effaced) near your due date. WHAT TO EXPECT AT YOUR  PRENATAL EXAMS  You will have prenatal exams every 2 weeks until week 36. Then, you will have weekly prenatal exams. During a routine prenatal visit:  You will be weighed to make sure you and the fetus are growing normally.  Your blood pressure is taken.  Your abdomen will be measured to track your baby's growth.  The fetal heartbeat will be listened to.  Any test results from the previous visit will be discussed.  You may have a cervical check near your due date to see if you have effaced. At around 36 weeks, your caregiver will check your cervix. At the same time, your caregiver will also perform a test on the secretions of the vaginal tissue. This test is to determine if a type of bacteria, Group B streptococcus, is present. Your caregiver will explain this further. Your caregiver may ask you:  What your birth plan is.  How you are feeling.  If you are feeling the baby move.  If you have had any abnormal symptoms, such as leaking fluid, bleeding, severe headaches, or abdominal cramping.  If you have any questions. Other tests or screenings that may be performed during your third trimester include:  Blood tests that check for low iron levels (anemia).  Fetal testing to check the health, activity level, and growth of the fetus. Testing is done if you have certain medical conditions or if there are problems during the pregnancy. FALSE LABOR You may feel small, irregular contractions that   eventually go away. These are called Braxton Hicks contractions, or false labor. Contractions may last for hours, days, or even weeks before true labor sets in. If contractions come at regular intervals, intensify, or become painful, it is best to be seen by your caregiver.  SIGNS OF LABOR   Menstrual-like cramps.  Contractions that are 5 minutes apart or less.  Contractions that start on the top of the uterus and spread down to the lower abdomen and back.  A sense of increased pelvic  pressure or back pain.  A watery or bloody mucus discharge that comes from the vagina. If you have any of these signs before the 37th week of pregnancy, call your caregiver right away. You need to go to the hospital to get checked immediately. HOME CARE INSTRUCTIONS   Avoid all smoking, herbs, alcohol, and unprescribed drugs. These chemicals affect the formation and growth of the baby.  Follow your caregiver's instructions regarding medicine use. There are medicines that are either safe or unsafe to take during pregnancy.  Exercise only as directed by your caregiver. Experiencing uterine cramps is a good sign to stop exercising.  Continue to eat regular, healthy meals.  Wear a good support bra for breast tenderness.  Do not use hot tubs, steam rooms, or saunas.  Wear your seat belt at all times when driving.  Avoid raw meat, uncooked cheese, cat litter boxes, and soil used by cats. These carry germs that can cause birth defects in the baby.  Take your prenatal vitamins.  Try taking a stool softener (if your caregiver approves) if you develop constipation. Eat more high-fiber foods, such as fresh vegetables or fruit and whole grains. Drink plenty of fluids to keep your urine clear or pale yellow.  Take warm sitz baths to soothe any pain or discomfort caused by hemorrhoids. Use hemorrhoid cream if your caregiver approves.  If you develop varicose veins, wear support hose. Elevate your feet for 15 minutes, 3-4 times a day. Limit salt in your diet.  Avoid heavy lifting, wear low heal shoes, and practice good posture.  Rest a lot with your legs elevated if you have leg cramps or low back pain.  Visit your dentist if you have not gone during your pregnancy. Use a soft toothbrush to brush your teeth and be gentle when you floss.  A sexual relationship may be continued unless your caregiver directs you otherwise.  Do not travel far distances unless it is absolutely necessary and only  with the approval of your caregiver.  Take prenatal classes to understand, practice, and ask questions about the labor and delivery.  Make a trial run to the hospital.  Pack your hospital bag.  Prepare the baby's nursery.  Continue to go to all your prenatal visits as directed by your caregiver. SEEK MEDICAL CARE IF:  You are unsure if you are in labor or if your water has broken.  You have dizziness.  You have mild pelvic cramps, pelvic pressure, or nagging pain in your abdominal area.  You have persistent nausea, vomiting, or diarrhea.  You have a bad smelling vaginal discharge.  You have pain with urination. SEEK IMMEDIATE MEDICAL CARE IF:   You have a fever.  You are leaking fluid from your vagina.  You have spotting or bleeding from your vagina.  You have severe abdominal cramping or pain.  You have rapid weight loss or gain.  You have shortness of breath with chest pain.  You notice sudden or extreme swelling   of your face, hands, ankles, feet, or legs.  You have not felt your baby move in over an hour.  You have severe headaches that do not go away with medicine.  You have vision changes. Document Released: 02/27/2001 Document Revised: 03/10/2013 Document Reviewed: 05/06/2012 [employer] Patient Information 2015 [employer]. This information is not intended to replace advice given to you by your health care provider. Make sure you discuss any questions you have with your health care provider.  Breastfeeding Deciding to breastfeed is one of the best choices you can make for you and your baby. A change in hormones during pregnancy causes your breast tissue to grow and increases the number and size of your milk ducts. These hormones also allow proteins, sugars, and fats from your blood supply to make breast milk in your milk-producing glands. Hormones prevent breast milk from being released before your baby is born as well as prompt milk flow after birth. Once  breastfeeding has begun, thoughts of your baby, as well as his or her sucking or crying, can stimulate the release of milk from your milk-producing glands.  BENEFITS OF BREASTFEEDING For Your Baby  Your first milk (colostrum) helps your baby's digestive system function better.   There are antibodies in your milk that help your baby fight off infections.   Your baby has a lower incidence of asthma, allergies, and sudden infant death syndrome.   The nutrients in breast milk are better for your baby than infant formulas and are designed uniquely for your baby's needs.   Breast milk improves your baby's brain development.   Your baby is less likely to develop other conditions, such as childhood obesity, asthma, or type 2 diabetes mellitus.  For You   Breastfeeding helps to create a very special bond between you and your baby.   Breastfeeding is convenient. Breast milk is always available at the correct temperature and costs nothing.   Breastfeeding helps to burn calories and helps you lose the weight gained during pregnancy.   Breastfeeding makes your uterus contract to its prepregnancy size faster and slows bleeding (lochia) after you give birth.   Breastfeeding helps to lower your risk of developing type 2 diabetes mellitus, osteoporosis, and breast or ovarian cancer later in life. SIGNS THAT YOUR BABY IS HUNGRY Early Signs of Hunger  Increased alertness or activity.  Stretching.  Movement of the head from side to side.  Movement of the head and opening of the mouth when the corner of the mouth or cheek is stroked (rooting).  Increased sucking sounds, smacking lips, cooing, sighing, or squeaking.  Hand-to-mouth movements.  Increased sucking of fingers or hands. Late Signs of Hunger  Fussing.  Intermittent crying. Extreme Signs of Hunger Signs of extreme hunger will require calming and consoling before your baby will be able to breastfeed successfully. Do not  wait for the following signs of extreme hunger to occur before you initiate breastfeeding:   Restlessness.  A loud, strong cry.   Screaming. BREASTFEEDING BASICS Breastfeeding Initiation  Find a comfortable place to sit or lie down, with your neck and back well supported.  Place a pillow or rolled up blanket under your baby to bring him or her to the level of your breast (if you are seated). Nursing pillows are specially designed to help support your arms and your baby while you breastfeed.  Make sure that your baby's abdomen is facing your abdomen.   Gently massage your breast. With your fingertips, massage from your chest   wall toward your nipple in a circular motion. This encourages milk flow. You may need to continue this action during the feeding if your milk flows slowly.  Support your breast with 4 fingers underneath and your thumb above your nipple. Make sure your fingers are well away from your nipple and your baby's mouth.   Stroke your baby's lips gently with your finger or nipple.   When your baby's mouth is open wide enough, quickly bring your baby to your breast, placing your entire nipple and as much of the colored area around your nipple (areola) as possible into your baby's mouth.   More areola should be visible above your baby's upper lip than below the lower lip.   Your baby's tongue should be between his or her lower gum and your breast.   Ensure that your baby's mouth is correctly positioned around your nipple (latched). Your baby's lips should create a seal on your breast and be turned out (everted).  It is common for your baby to suck about 2-3 minutes in order to start the flow of breast milk. Latching Teaching your baby how to latch on to your breast properly is very important. An improper latch can cause nipple pain and decreased milk supply for you and poor weight gain in your baby. Also, if your baby is not latched onto your nipple properly, he or she  may swallow some air during feeding. This can make your baby fussy. Burping your baby when you switch breasts during the feeding can help to get rid of the air. However, teaching your baby to latch on properly is still the best way to prevent fussiness from swallowing air while breastfeeding. Signs that your baby has successfully latched on to your nipple:    Silent tugging or silent sucking, without causing you pain.   Swallowing heard between every 3-4 sucks.    Muscle movement above and in front of his or her ears while sucking.  Signs that your baby has not successfully latched on to nipple:   Sucking sounds or smacking sounds from your baby while breastfeeding.  Nipple pain. If you think your baby has not latched on correctly, slip your finger into the corner of your baby's mouth to break the suction and place it between your baby's gums. Attempt breastfeeding initiation again. Signs of Successful Breastfeeding Signs from your baby:   A gradual decrease in the number of sucks or complete cessation of sucking.   Falling asleep.   Relaxation of his or her body.   Retention of a small amount of milk in his or her mouth.   Letting go of your breast by himself or herself. Signs from you:  Breasts that have increased in firmness, weight, and size 1-3 hours after feeding.   Breasts that are softer immediately after breastfeeding.  Increased milk volume, as well as a change in milk consistency and color by the fifth day of breastfeeding.   Nipples that are not sore, cracked, or bleeding. Signs That Your Baby is Getting Enough Milk  Wetting at least 3 diapers in a 24-hour period. The urine should be clear and pale yellow by age 5 days.  At least 3 stools in a 24-hour period by age 5 days. The stool should be soft and yellow.  At least 3 stools in a 24-hour period by age 7 days. The stool should be seedy and yellow.  No loss of weight greater than 10% of birth weight  during the first 3   days of age.  Average weight gain of 4-7 ounces (113-198 g) per week after age 4 days.  Consistent daily weight gain by age 5 days, without weight loss after the age of 2 weeks. After a feeding, your baby may spit up a small amount. This is common. BREASTFEEDING FREQUENCY AND DURATION Frequent feeding will help you make more milk and can prevent sore nipples and breast engorgement. Breastfeed when you feel the need to reduce the fullness of your breasts or when your baby shows signs of hunger. This is called "breastfeeding on demand." Avoid introducing a pacifier to your baby while you are working to establish breastfeeding (the first 4-6 weeks after your baby is born). After this time you may choose to use a pacifier. Research has shown that pacifier use during the first year of a baby's life decreases the risk of sudden infant death syndrome (SIDS). Allow your baby to feed on each breast as long as he or she wants. Breastfeed until your baby is finished feeding. When your baby unlatches or falls asleep while feeding from the first breast, offer the second breast. Because newborns are often sleepy in the first few weeks of life, you may need to awaken your baby to get him or her to feed. Breastfeeding times will vary from baby to baby. However, the following rules can serve as a guide to help you ensure that your baby is properly fed:  Newborns (babies 4 weeks of age or younger) may breastfeed every 1-3 hours.  Newborns should not go longer than 3 hours during the day or 5 hours during the night without breastfeeding.  You should breastfeed your baby a minimum of 8 times in a 24-hour period until you begin to introduce solid foods to your baby at around 6 months of age. BREAST MILK PUMPING Pumping and storing breast milk allows you to ensure that your baby is exclusively fed your breast milk, even at times when you are unable to breastfeed. This is especially important if you are  going back to work while you are still breastfeeding or when you are not able to be present during feedings. Your lactation consultant can give you guidelines on how long it is safe to store breast milk.  A breast pump is a machine that allows you to pump milk from your breast into a sterile bottle. The pumped breast milk can then be stored in a refrigerator or freezer. Some breast pumps are operated by hand, while others use electricity. Ask your lactation consultant which type will work best for you. Breast pumps can be purchased, but some hospitals and breastfeeding support groups lease breast pumps on a monthly basis. A lactation consultant can teach you how to hand express breast milk, if you prefer not to use a pump.  CARING FOR YOUR BREASTS WHILE YOU BREASTFEED Nipples can become dry, cracked, and sore while breastfeeding. The following recommendations can help keep your breasts moisturized and healthy:  Avoid using soap on your nipples.   Wear a supportive bra. Although not required, special nursing bras and tank tops are designed to allow access to your breasts for breastfeeding without taking off your entire bra or top. Avoid wearing underwire-style bras or extremely tight bras.  Air dry your nipples for 3-4minutes after each feeding.   Use only cotton bra pads to absorb leaked breast milk. Leaking of breast milk between feedings is normal.   Use lanolin on your nipples after breastfeeding. Lanolin helps to maintain your skin's   normal moisture barrier. If you use pure lanolin, you do not need to wash it off before feeding your baby again. Pure lanolin is not toxic to your baby. You may also hand express a few drops of breast milk and gently massage that milk into your nipples and allow the milk to air dry. In the first few weeks after giving birth, some women experience extremely full breasts (engorgement). Engorgement can make your breasts feel heavy, warm, and tender to the touch.  Engorgement peaks within 3-5 days after you give birth. The following recommendations can help ease engorgement:  Completely empty your breasts while breastfeeding or pumping. You may want to start by applying warm, moist heat (in the shower or with warm water-soaked hand towels) just before feeding or pumping. This increases circulation and helps the milk flow. If your baby does not completely empty your breasts while breastfeeding, pump any extra milk after he or she is finished.  Wear a snug bra (nursing or regular) or tank top for 1-2 days to signal your body to slightly decrease milk production.  Apply ice packs to your breasts, unless this is too uncomfortable for you.  Make sure that your baby is latched on and positioned properly while breastfeeding. If engorgement persists after 48 hours of following these recommendations, contact your health care provider or a lactation consultant. OVERALL HEALTH CARE RECOMMENDATIONS WHILE BREASTFEEDING  Eat healthy foods. Alternate between meals and snacks, eating 3 of each per day. Because what you eat affects your breast milk, some of the foods may make your baby more irritable than usual. Avoid eating these foods if you are sure that they are negatively affecting your baby.  Drink milk, fruit juice, and water to satisfy your thirst (about 10 glasses a day).   Rest often, relax, and continue to take your prenatal vitamins to prevent fatigue, stress, and anemia.  Continue breast self-awareness checks.  Avoid chewing and smoking tobacco.  Avoid alcohol and drug use. Some medicines that may be harmful to your baby can pass through breast milk. It is important to ask your health care provider before taking any medicine, including all over-the-counter and prescription medicine as well as vitamin and herbal supplements. It is possible to become pregnant while breastfeeding. If birth control is desired, ask your health care provider about options that  will be safe for your baby. SEEK MEDICAL CARE IF:   You feel like you want to stop breastfeeding or have become frustrated with breastfeeding.  You have painful breasts or nipples.  Your nipples are cracked or bleeding.  Your breasts are red, tender, or warm.  You have a swollen area on either breast.  You have a fever or chills.  You have nausea or vomiting.  You have drainage other than breast milk from your nipples.  Your breasts do not become full before feedings by the fifth day after you give birth.  You feel sad and depressed.  Your baby is too sleepy to eat well.  Your baby is having trouble sleeping.   Your baby is wetting less than 3 diapers in a 24-hour period.  Your baby has less than 3 stools in a 24-hour period.  Your baby's skin or the white part of his or her eyes becomes yellow.   Your baby is not gaining weight by 5 days of age. SEEK IMMEDIATE MEDICAL CARE IF:   Your baby is overly tired (lethargic) and does not want to wake up and feed.  Your baby   develops an unexplained fever. Document Released: 03/05/2005 Document Revised: 03/10/2013 Document Reviewed: 08/27/2012 [employer] Patient Information 2015 [employer]. This information is not intended to replace advice given to you by your health care provider. Make sure you discuss any questions you have with your health care provider.  

## 2014-06-16 ENCOUNTER — Ambulatory Visit (INDEPENDENT_AMBULATORY_CARE_PROVIDER_SITE_OTHER): Payer: 59 | Admitting: Obstetrics & Gynecology

## 2014-06-16 VITALS — BP 115/74 | HR 101 | Wt 169.0 lb

## 2014-06-16 DIAGNOSIS — J069 Acute upper respiratory infection, unspecified: Secondary | ICD-10-CM

## 2014-06-16 DIAGNOSIS — Z3493 Encounter for supervision of normal pregnancy, unspecified, third trimester: Secondary | ICD-10-CM

## 2014-06-16 DIAGNOSIS — Z36 Encounter for antenatal screening of mother: Secondary | ICD-10-CM | POA: Diagnosis not present

## 2014-06-16 LAB — OB RESULTS CONSOLE GC/CHLAMYDIA
CHLAMYDIA, DNA PROBE: NEGATIVE
Gonorrhea: NEGATIVE

## 2014-06-16 LAB — OB RESULTS CONSOLE GBS: GBS: POSITIVE

## 2014-06-16 MED ORDER — AZITHROMYCIN 250 MG PO TABS: ORAL_TABLET | ORAL | Status: DC

## 2014-06-16 NOTE — Patient Instructions (Signed)
Return to clinic for any obstetric concerns or go to [facility] for evaluation  

## 2014-06-16 NOTE — Progress Notes (Signed)
Pt has been having cold symptoms.  Pt has tried Claritin D and helped her head congestion, but it has now moved into her chest and she is coughing a lot.

## 2014-06-16 NOTE — Progress Notes (Signed)
Azithromycin pack prescribed, will monitor response. Pelvic cultures done today. No other complaints or concerns.  Labor and fetal movement precautions reviewed.

## 2014-06-17 LAB — GC/CHLAMYDIA PROBE AMP
CT Probe RNA: NEGATIVE
GC Probe RNA: NEGATIVE

## 2014-06-18 ENCOUNTER — Encounter: Payer: Self-pay | Admitting: Obstetrics & Gynecology

## 2014-06-18 DIAGNOSIS — O99820 Streptococcus B carrier state complicating pregnancy: Secondary | ICD-10-CM | POA: Insufficient documentation

## 2014-06-20 LAB — CULTURE, STREPTOCOCCUS GRP B W/SUSCEPT

## 2014-06-24 ENCOUNTER — Ambulatory Visit (INDEPENDENT_AMBULATORY_CARE_PROVIDER_SITE_OTHER): Payer: 59 | Admitting: Family Medicine

## 2014-06-24 VITALS — BP 119/76 | HR 96 | Wt 169.0 lb

## 2014-06-24 DIAGNOSIS — O99820 Streptococcus B carrier state complicating pregnancy: Secondary | ICD-10-CM

## 2014-06-24 DIAGNOSIS — Z3493 Encounter for supervision of normal pregnancy, unspecified, third trimester: Secondary | ICD-10-CM

## 2014-06-24 NOTE — Patient Instructions (Signed)
Third Trimester of Pregnancy The third trimester is from week 29 through week 42, months 7 through 9. The third trimester is a time when the fetus is growing rapidly. At the end of the ninth month, the fetus is about 20 inches in length and weighs 6-10 pounds.  BODY CHANGES Your body goes through many changes during pregnancy. The changes vary from woman to woman.   Your weight will continue to increase. You can expect to gain 25-35 pounds (11-16 kg) by the end of the pregnancy.  You may begin to get stretch marks on your hips, abdomen, and breasts.  You may urinate more often because the fetus is moving lower into your pelvis and pressing on your bladder.  You may develop or continue to have heartburn as a result of your pregnancy.  You may develop constipation because certain hormones are causing the muscles that push waste through your intestines to slow down.  You may develop hemorrhoids or swollen, bulging veins (varicose veins).  You may have pelvic pain because of the weight gain and pregnancy hormones relaxing your joints between the bones in your pelvis. Backaches may result from overexertion of the muscles supporting your posture.  You may have changes in your hair. These can include thickening of your hair, rapid growth, and changes in texture. Some women also have hair loss during or after pregnancy, or hair that feels dry or thin. Your hair will most likely return to normal after your baby is born.  Your breasts will continue to grow and be tender. A yellow discharge may leak from your breasts called colostrum.  Your belly button may stick out.  You may feel short of breath because of your expanding uterus.  You may notice the fetus "dropping," or moving lower in your abdomen.  You may have a bloody mucus discharge. This usually occurs a few days to a week before labor begins.  Your cervix becomes thin and soft (effaced) near your due date. WHAT TO EXPECT AT YOUR  PRENATAL EXAMS  You will have prenatal exams every 2 weeks until week 36. Then, you will have weekly prenatal exams. During a routine prenatal visit:  You will be weighed to make sure you and the fetus are growing normally.  Your blood pressure is taken.  Your abdomen will be measured to track your baby's growth.  The fetal heartbeat will be listened to.  Any test results from the previous visit will be discussed.  You may have a cervical check near your due date to see if you have effaced. At around 36 weeks, your caregiver will check your cervix. At the same time, your caregiver will also perform a test on the secretions of the vaginal tissue. This test is to determine if a type of bacteria, Group B streptococcus, is present. Your caregiver will explain this further. Your caregiver may ask you:  What your birth plan is.  How you are feeling.  If you are feeling the baby move.  If you have had any abnormal symptoms, such as leaking fluid, bleeding, severe headaches, or abdominal cramping.  If you have any questions. Other tests or screenings that may be performed during your third trimester include:  Blood tests that check for low iron levels (anemia).  Fetal testing to check the health, activity level, and growth of the fetus. Testing is done if you have certain medical conditions or if there are problems during the pregnancy. FALSE LABOR You may feel small, irregular contractions that   eventually go away. These are called Braxton Hicks contractions, or false labor. Contractions may last for hours, days, or even weeks before true labor sets in. If contractions come at regular intervals, intensify, or become painful, it is best to be seen by your caregiver.  SIGNS OF LABOR   Menstrual-like cramps.  Contractions that are 5 minutes apart or less.  Contractions that start on the top of the uterus and spread down to the lower abdomen and back.  A sense of increased pelvic  pressure or back pain.  A watery or bloody mucus discharge that comes from the vagina. If you have any of these signs before the 37th week of pregnancy, call your caregiver right away. You need to go to the hospital to get checked immediately. HOME CARE INSTRUCTIONS   Avoid all smoking, herbs, alcohol, and unprescribed drugs. These chemicals affect the formation and growth of the baby.  Follow your caregiver's instructions regarding medicine use. There are medicines that are either safe or unsafe to take during pregnancy.  Exercise only as directed by your caregiver. Experiencing uterine cramps is a good sign to stop exercising.  Continue to eat regular, healthy meals.  Wear a good support bra for breast tenderness.  Do not use hot tubs, steam rooms, or saunas.  Wear your seat belt at all times when driving.  Avoid raw meat, uncooked cheese, cat litter boxes, and soil used by cats. These carry germs that can cause birth defects in the baby.  Take your prenatal vitamins.  Try taking a stool softener (if your caregiver approves) if you develop constipation. Eat more high-fiber foods, such as fresh vegetables or fruit and whole grains. Drink plenty of fluids to keep your urine clear or pale yellow.  Take warm sitz baths to soothe any pain or discomfort caused by hemorrhoids. Use hemorrhoid cream if your caregiver approves.  If you develop varicose veins, wear support hose. Elevate your feet for 15 minutes, 3-4 times a day. Limit salt in your diet.  Avoid heavy lifting, wear low heal shoes, and practice good posture.  Rest a lot with your legs elevated if you have leg cramps or low back pain.  Visit your dentist if you have not gone during your pregnancy. Use a soft toothbrush to brush your teeth and be gentle when you floss.  A sexual relationship may be continued unless your caregiver directs you otherwise.  Do not travel far distances unless it is absolutely necessary and only  with the approval of your caregiver.  Take prenatal classes to understand, practice, and ask questions about the labor and delivery.  Make a trial run to the hospital.  Pack your hospital bag.  Prepare the baby's nursery.  Continue to go to all your prenatal visits as directed by your caregiver. SEEK MEDICAL CARE IF:  You are unsure if you are in labor or if your water has broken.  You have dizziness.  You have mild pelvic cramps, pelvic pressure, or nagging pain in your abdominal area.  You have persistent nausea, vomiting, or diarrhea.  You have a bad smelling vaginal discharge.  You have pain with urination. SEEK IMMEDIATE MEDICAL CARE IF:   You have a fever.  You are leaking fluid from your vagina.  You have spotting or bleeding from your vagina.  You have severe abdominal cramping or pain.  You have rapid weight loss or gain.  You have shortness of breath with chest pain.  You notice sudden or extreme swelling   of your face, hands, ankles, feet, or legs.  You have not felt your baby move in over an hour.  You have severe headaches that do not go away with medicine.  You have vision changes. Document Released: 02/27/2001 Document Revised: 03/10/2013 Document Reviewed: 05/06/2012 [employer] Patient Information 2015 [employer]. This information is not intended to replace advice given to you by your health care provider. Make sure you discuss any questions you have with your health care provider.  Breastfeeding Deciding to breastfeed is one of the best choices you can make for you and your baby. A change in hormones during pregnancy causes your breast tissue to grow and increases the number and size of your milk ducts. These hormones also allow proteins, sugars, and fats from your blood supply to make breast milk in your milk-producing glands. Hormones prevent breast milk from being released before your baby is born as well as prompt milk flow after birth. Once  breastfeeding has begun, thoughts of your baby, as well as his or her sucking or crying, can stimulate the release of milk from your milk-producing glands.  BENEFITS OF BREASTFEEDING For Your Baby  Your first milk (colostrum) helps your baby's digestive system function better.   There are antibodies in your milk that help your baby fight off infections.   Your baby has a lower incidence of asthma, allergies, and sudden infant death syndrome.   The nutrients in breast milk are better for your baby than infant formulas and are designed uniquely for your baby's needs.   Breast milk improves your baby's brain development.   Your baby is less likely to develop other conditions, such as childhood obesity, asthma, or type 2 diabetes mellitus.  For You   Breastfeeding helps to create a very special bond between you and your baby.   Breastfeeding is convenient. Breast milk is always available at the correct temperature and costs nothing.   Breastfeeding helps to burn calories and helps you lose the weight gained during pregnancy.   Breastfeeding makes your uterus contract to its prepregnancy size faster and slows bleeding (lochia) after you give birth.   Breastfeeding helps to lower your risk of developing type 2 diabetes mellitus, osteoporosis, and breast or ovarian cancer later in life. SIGNS THAT YOUR BABY IS HUNGRY Early Signs of Hunger  Increased alertness or activity.  Stretching.  Movement of the head from side to side.  Movement of the head and opening of the mouth when the corner of the mouth or cheek is stroked (rooting).  Increased sucking sounds, smacking lips, cooing, sighing, or squeaking.  Hand-to-mouth movements.  Increased sucking of fingers or hands. Late Signs of Hunger  Fussing.  Intermittent crying. Extreme Signs of Hunger Signs of extreme hunger will require calming and consoling before your baby will be able to breastfeed successfully. Do not  wait for the following signs of extreme hunger to occur before you initiate breastfeeding:   Restlessness.  A loud, strong cry.   Screaming. BREASTFEEDING BASICS Breastfeeding Initiation  Find a comfortable place to sit or lie down, with your neck and back well supported.  Place a pillow or rolled up blanket under your baby to bring him or her to the level of your breast (if you are seated). Nursing pillows are specially designed to help support your arms and your baby while you breastfeed.  Make sure that your baby's abdomen is facing your abdomen.   Gently massage your breast. With your fingertips, massage from your chest   wall toward your nipple in a circular motion. This encourages milk flow. You may need to continue this action during the feeding if your milk flows slowly.  Support your breast with 4 fingers underneath and your thumb above your nipple. Make sure your fingers are well away from your nipple and your baby's mouth.   Stroke your baby's lips gently with your finger or nipple.   When your baby's mouth is open wide enough, quickly bring your baby to your breast, placing your entire nipple and as much of the colored area around your nipple (areola) as possible into your baby's mouth.   More areola should be visible above your baby's upper lip than below the lower lip.   Your baby's tongue should be between his or her lower gum and your breast.   Ensure that your baby's mouth is correctly positioned around your nipple (latched). Your baby's lips should create a seal on your breast and be turned out (everted).  It is common for your baby to suck about 2-3 minutes in order to start the flow of breast milk. Latching Teaching your baby how to latch on to your breast properly is very important. An improper latch can cause nipple pain and decreased milk supply for you and poor weight gain in your baby. Also, if your baby is not latched onto your nipple properly, he or she  may swallow some air during feeding. This can make your baby fussy. Burping your baby when you switch breasts during the feeding can help to get rid of the air. However, teaching your baby to latch on properly is still the best way to prevent fussiness from swallowing air while breastfeeding. Signs that your baby has successfully latched on to your nipple:    Silent tugging or silent sucking, without causing you pain.   Swallowing heard between every 3-4 sucks.    Muscle movement above and in front of his or her ears while sucking.  Signs that your baby has not successfully latched on to nipple:   Sucking sounds or smacking sounds from your baby while breastfeeding.  Nipple pain. If you think your baby has not latched on correctly, slip your finger into the corner of your baby's mouth to break the suction and place it between your baby's gums. Attempt breastfeeding initiation again. Signs of Successful Breastfeeding Signs from your baby:   A gradual decrease in the number of sucks or complete cessation of sucking.   Falling asleep.   Relaxation of his or her body.   Retention of a small amount of milk in his or her mouth.   Letting go of your breast by himself or herself. Signs from you:  Breasts that have increased in firmness, weight, and size 1-3 hours after feeding.   Breasts that are softer immediately after breastfeeding.  Increased milk volume, as well as a change in milk consistency and color by the fifth day of breastfeeding.   Nipples that are not sore, cracked, or bleeding. Signs That Your Baby is Getting Enough Milk  Wetting at least 3 diapers in a 24-hour period. The urine should be clear and pale yellow by age 5 days.  At least 3 stools in a 24-hour period by age 5 days. The stool should be soft and yellow.  At least 3 stools in a 24-hour period by age 7 days. The stool should be seedy and yellow.  No loss of weight greater than 10% of birth weight  during the first 3   days of age.  Average weight gain of 4-7 ounces (113-198 g) per week after age 4 days.  Consistent daily weight gain by age 5 days, without weight loss after the age of 2 weeks. After a feeding, your baby may spit up a small amount. This is common. BREASTFEEDING FREQUENCY AND DURATION Frequent feeding will help you make more milk and can prevent sore nipples and breast engorgement. Breastfeed when you feel the need to reduce the fullness of your breasts or when your baby shows signs of hunger. This is called "breastfeeding on demand." Avoid introducing a pacifier to your baby while you are working to establish breastfeeding (the first 4-6 weeks after your baby is born). After this time you may choose to use a pacifier. Research has shown that pacifier use during the first year of a baby's life decreases the risk of sudden infant death syndrome (SIDS). Allow your baby to feed on each breast as long as he or she wants. Breastfeed until your baby is finished feeding. When your baby unlatches or falls asleep while feeding from the first breast, offer the second breast. Because newborns are often sleepy in the first few weeks of life, you may need to awaken your baby to get him or her to feed. Breastfeeding times will vary from baby to baby. However, the following rules can serve as a guide to help you ensure that your baby is properly fed:  Newborns (babies 4 weeks of age or younger) may breastfeed every 1-3 hours.  Newborns should not go longer than 3 hours during the day or 5 hours during the night without breastfeeding.  You should breastfeed your baby a minimum of 8 times in a 24-hour period until you begin to introduce solid foods to your baby at around 6 months of age. BREAST MILK PUMPING Pumping and storing breast milk allows you to ensure that your baby is exclusively fed your breast milk, even at times when you are unable to breastfeed. This is especially important if you are  going back to work while you are still breastfeeding or when you are not able to be present during feedings. Your lactation consultant can give you guidelines on how long it is safe to store breast milk.  A breast pump is a machine that allows you to pump milk from your breast into a sterile bottle. The pumped breast milk can then be stored in a refrigerator or freezer. Some breast pumps are operated by hand, while others use electricity. Ask your lactation consultant which type will work best for you. Breast pumps can be purchased, but some hospitals and breastfeeding support groups lease breast pumps on a monthly basis. A lactation consultant can teach you how to hand express breast milk, if you prefer not to use a pump.  CARING FOR YOUR BREASTS WHILE YOU BREASTFEED Nipples can become dry, cracked, and sore while breastfeeding. The following recommendations can help keep your breasts moisturized and healthy:  Avoid using soap on your nipples.   Wear a supportive bra. Although not required, special nursing bras and tank tops are designed to allow access to your breasts for breastfeeding without taking off your entire bra or top. Avoid wearing underwire-style bras or extremely tight bras.  Air dry your nipples for 3-4minutes after each feeding.   Use only cotton bra pads to absorb leaked breast milk. Leaking of breast milk between feedings is normal.   Use lanolin on your nipples after breastfeeding. Lanolin helps to maintain your skin's   normal moisture barrier. If you use pure lanolin, you do not need to wash it off before feeding your baby again. Pure lanolin is not toxic to your baby. You may also hand express a few drops of breast milk and gently massage that milk into your nipples and allow the milk to air dry. In the first few weeks after giving birth, some women experience extremely full breasts (engorgement). Engorgement can make your breasts feel heavy, warm, and tender to the touch.  Engorgement peaks within 3-5 days after you give birth. The following recommendations can help ease engorgement:  Completely empty your breasts while breastfeeding or pumping. You may want to start by applying warm, moist heat (in the shower or with warm water-soaked hand towels) just before feeding or pumping. This increases circulation and helps the milk flow. If your baby does not completely empty your breasts while breastfeeding, pump any extra milk after he or she is finished.  Wear a snug bra (nursing or regular) or tank top for 1-2 days to signal your body to slightly decrease milk production.  Apply ice packs to your breasts, unless this is too uncomfortable for you.  Make sure that your baby is latched on and positioned properly while breastfeeding. If engorgement persists after 48 hours of following these recommendations, contact your health care provider or a lactation consultant. OVERALL HEALTH CARE RECOMMENDATIONS WHILE BREASTFEEDING  Eat healthy foods. Alternate between meals and snacks, eating 3 of each per day. Because what you eat affects your breast milk, some of the foods may make your baby more irritable than usual. Avoid eating these foods if you are sure that they are negatively affecting your baby.  Drink milk, fruit juice, and water to satisfy your thirst (about 10 glasses a day).   Rest often, relax, and continue to take your prenatal vitamins to prevent fatigue, stress, and anemia.  Continue breast self-awareness checks.  Avoid chewing and smoking tobacco.  Avoid alcohol and drug use. Some medicines that may be harmful to your baby can pass through breast milk. It is important to ask your health care provider before taking any medicine, including all over-the-counter and prescription medicine as well as vitamin and herbal supplements. It is possible to become pregnant while breastfeeding. If birth control is desired, ask your health care provider about options that  will be safe for your baby. SEEK MEDICAL CARE IF:   You feel like you want to stop breastfeeding or have become frustrated with breastfeeding.  You have painful breasts or nipples.  Your nipples are cracked or bleeding.  Your breasts are red, tender, or warm.  You have a swollen area on either breast.  You have a fever or chills.  You have nausea or vomiting.  You have drainage other than breast milk from your nipples.  Your breasts do not become full before feedings by the fifth day after you give birth.  You feel sad and depressed.  Your baby is too sleepy to eat well.  Your baby is having trouble sleeping.   Your baby is wetting less than 3 diapers in a 24-hour period.  Your baby has less than 3 stools in a 24-hour period.  Your baby's skin or the white part of his or her eyes becomes yellow.   Your baby is not gaining weight by 5 days of age. SEEK IMMEDIATE MEDICAL CARE IF:   Your baby is overly tired (lethargic) and does not want to wake up and feed.  Your baby   develops an unexplained fever. Document Released: 03/05/2005 Document Revised: 03/10/2013 Document Reviewed: 08/27/2012 [employer] Patient Information 2015 [employer]. This information is not intended to replace advice given to you by your health care provider. Make sure you discuss any questions you have with your health care provider.  

## 2014-06-24 NOTE — Progress Notes (Signed)
GBS positive--resistant to clindamycin, and erythromycin--allergic to PCN and Keflex--for Vanc

## 2014-06-30 ENCOUNTER — Ambulatory Visit (INDEPENDENT_AMBULATORY_CARE_PROVIDER_SITE_OTHER): Payer: 59 | Admitting: Obstetrics & Gynecology

## 2014-06-30 VITALS — BP 112/77 | HR 98 | Wt 169.0 lb

## 2014-06-30 DIAGNOSIS — Z3493 Encounter for supervision of normal pregnancy, unspecified, third trimester: Secondary | ICD-10-CM

## 2014-06-30 DIAGNOSIS — O99820 Streptococcus B carrier state complicating pregnancy: Secondary | ICD-10-CM

## 2014-06-30 DIAGNOSIS — Z22330 Carrier of Group B streptococcus: Secondary | ICD-10-CM

## 2014-06-30 NOTE — Progress Notes (Signed)
Unchanged cervix, reassured patient this was okay.  Labor and fetal movement precautions reviewed.

## 2014-06-30 NOTE — Patient Instructions (Signed)
Return to clinic for any obstetric concerns or go to [facility] for evaluation  

## 2014-07-07 ENCOUNTER — Encounter: Payer: Self-pay | Admitting: Obstetrics & Gynecology

## 2014-07-07 ENCOUNTER — Ambulatory Visit (INDEPENDENT_AMBULATORY_CARE_PROVIDER_SITE_OTHER): Payer: 59 | Admitting: Obstetrics & Gynecology

## 2014-07-07 VITALS — BP 125/87 | HR 98 | Wt 171.6 lb

## 2014-07-07 DIAGNOSIS — O99820 Streptococcus B carrier state complicating pregnancy: Secondary | ICD-10-CM

## 2014-07-07 DIAGNOSIS — Z22330 Carrier of Group B streptococcus: Secondary | ICD-10-CM

## 2014-07-07 DIAGNOSIS — Z3493 Encounter for supervision of normal pregnancy, unspecified, third trimester: Secondary | ICD-10-CM

## 2014-07-07 NOTE — Progress Notes (Signed)
Membranes swept, had small amount of bleeding. After membrane sweeping, cervix 3/70/-2.  Labor and fetal movement precautions reviewed. Postdates testing to start next week if still pregnant.

## 2014-07-07 NOTE — Patient Instructions (Signed)
Return to clinic for any obstetric concerns or go to [facility] for evaluation  

## 2014-07-13 ENCOUNTER — Encounter: Payer: Self-pay | Admitting: Obstetrics & Gynecology

## 2014-07-13 ENCOUNTER — Encounter (HOSPITAL_COMMUNITY): Payer: Self-pay | Admitting: *Deleted

## 2014-07-13 ENCOUNTER — Ambulatory Visit (INDEPENDENT_AMBULATORY_CARE_PROVIDER_SITE_OTHER): Payer: 59 | Admitting: Obstetrics & Gynecology

## 2014-07-13 ENCOUNTER — Inpatient Hospital Stay (HOSPITAL_COMMUNITY)
Admission: AD | Admit: 2014-07-13 | Discharge: 2014-07-13 | Disposition: A | Discharge status: Home / Self Care | Payer: 59 | Source: Ambulatory Visit | Attending: Family Medicine | Admitting: Family Medicine

## 2014-07-13 ENCOUNTER — Telehealth (HOSPITAL_COMMUNITY): Payer: Self-pay | Admitting: *Deleted

## 2014-07-13 VITALS — BP 125/84 | HR 106 | Wt 171.0 lb

## 2014-07-13 DIAGNOSIS — O2343 Unspecified infection of urinary tract in pregnancy, third trimester: Secondary | ICD-10-CM

## 2014-07-13 DIAGNOSIS — B9689 Other specified bacterial agents as the cause of diseases classified elsewhere: Secondary | ICD-10-CM

## 2014-07-13 DIAGNOSIS — N760 Acute vaginitis: Secondary | ICD-10-CM | POA: Diagnosis not present

## 2014-07-13 DIAGNOSIS — Z3A39 39 weeks gestation of pregnancy: Secondary | ICD-10-CM | POA: Diagnosis not present

## 2014-07-13 DIAGNOSIS — O23593 Infection of other part of genital tract in pregnancy, third trimester: Secondary | ICD-10-CM | POA: Diagnosis not present

## 2014-07-13 DIAGNOSIS — O9989 Other specified diseases and conditions complicating pregnancy, childbirth and the puerperium: Secondary | ICD-10-CM | POA: Diagnosis present

## 2014-07-13 DIAGNOSIS — Z3493 Encounter for supervision of normal pregnancy, unspecified, third trimester: Secondary | ICD-10-CM

## 2014-07-13 LAB — WET PREP, GENITAL
Trich, Wet Prep: NONE SEEN
Yeast Wet Prep HPF POC: NONE SEEN

## 2014-07-13 LAB — URINE MICROSCOPIC-ADD ON

## 2014-07-13 LAB — URINALYSIS, ROUTINE W REFLEX MICROSCOPIC
Bilirubin Urine: NEGATIVE
GLUCOSE, UA: NEGATIVE mg/dL
Ketones, ur: NEGATIVE mg/dL
Nitrite: NEGATIVE
PH: 6 (ref 5.0–8.0)
PROTEIN: NEGATIVE mg/dL
SPECIFIC GRAVITY, URINE: 1.01 (ref 1.005–1.030)
UROBILINOGEN UA: 0.2 mg/dL (ref 0.0–1.0)

## 2014-07-13 LAB — POCT FERN TEST: POCT FERN TEST: NEGATIVE

## 2014-07-13 MED ORDER — NITROFURANTOIN MONOHYD MACRO 100 MG PO CAPS: 100.0000 mg | ORAL_CAPSULE | Freq: Two times a day (BID) | ORAL | Status: DC

## 2014-07-13 MED ORDER — METRONIDAZOLE 500 MG PO TABS: 500.0000 mg | ORAL_TABLET | Freq: Two times a day (BID) | ORAL | Status: DC

## 2014-07-13 NOTE — Progress Notes (Signed)
Routine visit. No problems. Good FM. Membranes swept. Labor precautions reviewed. IOL for 07-21-14 at 0730. NST in 4 days.

## 2014-07-13 NOTE — MAU Note (Signed)
Pt states she had membranes stripped for the 2nd time today and she noticed her underwear being wet

## 2014-07-13 NOTE — MAU Provider Note (Signed)
History     CSN: [id number]  Arrival date and time: 07/13/14 2007   None     Chief Complaint  Patient presents with  . Labor Eval   HPI Patient is 24 y.o. G2P0010 5611w6d here with complaints of leaking.  Reports that she has had her membranes stripped twice now.  She states that today about 2 hours ago, she noticed that she had clear fluid in her underwear, sometimes with a little brown in them.  Endorses sensation of contractions when she sits q2 hours, that lasts a min or two.  Endorses dysuria, urinary frequency.   +FM, denies VB, contractions, vaginal discharge, fevers, hematuria, urgency.  OB History    Gravida Para Term Preterm AB TAB SAB Ectopic Multiple Living   2    1  1    0      Past Medical History  Diagnosis Date  . Ruptured spleen   . Miscarriage   . GERD (gastroesophageal reflux disease)     Past Surgical History  Procedure Laterality Date  . Appendectomy    . Mouth surgery      Family History  Problem Relation Age of Onset  . Asthma Sister   . Asthma Brother     History  Substance Use Topics  . Smoking status: Current Every Day Smoker -- 1.00 packs/day for 2 years  . Smokeless tobacco: Current User     Comment: cutting back/trying to quit  . Alcohol Use: No    Allergies:  Allergies  Allergen Reactions  . Aspirin Nausea Only  . Keflex [Cephalexin] Nausea And Vomiting  . Penicillins Nausea And Vomiting  . Sulfa Antibiotics Rash    Prescriptions prior to admission  Medication Sig Dispense Refill Last Dose  . Prenatal Multivit-Min-Fe-FA (PRENATAL VITAMINS PO) Take 1 tablet by mouth daily.    07/13/2014 at Unknown time  . ranitidine (ZANTAC) 150 MG tablet Take 150 mg by mouth 2 (two) times daily as needed for heartburn.    07/13/2014 at Unknown time    ROS Physical Exam   Blood pressure 125/79, pulse 105, temperature 98.5 F (36.9 C), temperature source Oral, resp. rate 16, last menstrual period 10/07/2013.  Physical Exam   Constitutional: She is oriented to person, place, and time. She appears well-developed and well-nourished. No distress.  HENT:  Head: Normocephalic and atraumatic.  Eyes: EOM are normal. No scleral icterus.  Neck: Normal range of motion.  Respiratory: Effort normal. No respiratory distress.  GI: Soft.  gravid  Genitourinary: Vaginal discharge found.  No pooling, moderate white discharge  Musculoskeletal: Normal range of motion. She exhibits no edema.  Lymphadenopathy:    She has no cervical adenopathy.  Neurological: She is alert and oriented to person, place, and time.  Skin: Skin is warm and dry. No rash noted.  Psychiatric: She has a normal mood and affect. Her behavior is normal.   Fetal monitoringBaseline: 147 bpm, Variability: Good {> 6 bpm) and Accelerations: Reactive Uterine activityNone  Dilation: 3.5 Effacement (%): 70 Station: -2 Presentation: Vertex Exam by:: Dr.Gottschalk  Results for orders placed or performed during the hospital encounter of 07/13/14 (from the past 24 hour(s))  Urinalysis, Routine w reflex microscopic     Status: Abnormal   Collection Time: 07/13/14  8:11 PM  Result Value Ref Range   Color, Urine YELLOW YELLOW   APPearance CLEAR CLEAR   Specific Gravity, Urine 1.010 1.005 - 1.030   pH 6.0 5.0 - 8.0   Glucose, UA NEGATIVE NEGATIVE mg/dL  Hgb urine dipstick TRACE (A) NEGATIVE   Bilirubin Urine NEGATIVE NEGATIVE   Ketones, ur NEGATIVE NEGATIVE mg/dL   Protein, ur NEGATIVE NEGATIVE mg/dL   Urobilinogen, UA 0.2 0.0 - 1.0 mg/dL   Nitrite NEGATIVE NEGATIVE   Leukocytes, UA MODERATE (A) NEGATIVE  Urine microscopic-add on     Status: Abnormal   Collection Time: 07/13/14  8:11 PM  Result Value Ref Range   Squamous Epithelial / LPF RARE RARE   WBC, UA 7-10 <3 WBC/hpf   RBC / HPF 3-6 <3 RBC/hpf   Bacteria, UA FEW (A) RARE  Wet prep, genital     Status: Abnormal   Collection Time: 07/13/14  9:00 PM  Result Value Ref Range   Yeast Wet Prep HPF  POC NONE SEEN NONE SEEN   Trich, Wet Prep NONE SEEN NONE SEEN   Clue Cells Wet Prep HPF POC FEW (A) NONE SEEN   WBC, Wet Prep HPF POC MANY (A) NONE SEEN    MAU Course  Procedures  MDM  NST reactive Negative pooling, Fern negative UA, Wet prep obtained  Assessment and Plan  Bacterial vaginosis, UTI in pregnancy -Macrobid 100 BID x7d -Flagyl  BID x7 d -Return precautions discussed -Follow up with OB as scheduled   Delynn Flavin M , DO  07/13/2014, 8:49 PM   Seen also by me Agree with note Plan followup in office for prenatal visit Aviva Signs, CNM

## 2014-07-13 NOTE — Telephone Encounter (Signed)
Preadmission screen  

## 2014-07-13 NOTE — Discharge Instructions (Signed)
Bacterial Vaginosis °Bacterial vaginosis is a vaginal infection that occurs when the normal balance of bacteria in the vagina is disrupted. It results from an overgrowth of certain bacteria. This is the most common vaginal infection in women of childbearing age. Treatment is important to prevent complications, especially in pregnant women, as it can cause a premature delivery. °CAUSES  °Bacterial vaginosis is caused by an increase in harmful bacteria that are normally present in smaller amounts in the vagina. Several different kinds of bacteria can cause bacterial vaginosis. However, the reason that the condition develops is not fully understood. °RISK FACTORS °Certain activities or behaviors can put you at an increased risk of developing bacterial vaginosis, including: °· Having a new sex partner or multiple sex partners. °· Douching. °· Using an intrauterine device (IUD) for contraception. °Women do not get bacterial vaginosis from toilet seats, bedding, swimming pools, or contact with objects around them. °SIGNS AND SYMPTOMS  °Some women with bacterial vaginosis have no signs or symptoms. Common symptoms include: °· Grey vaginal discharge. °· A fishlike odor with discharge, especially after sexual intercourse. °· Itching or burning of the vagina and vulva. °· Burning or pain with urination. °DIAGNOSIS  °Your health care provider will take a medical history and examine the vagina for signs of bacterial vaginosis. A sample of vaginal fluid may be taken. Your health care provider will look at this sample under a microscope to check for bacteria and abnormal cells. A vaginal pH test may also be done.  °TREATMENT  °Bacterial vaginosis may be treated with antibiotic medicines. These may be given in the form of a pill or a vaginal cream. A second round of antibiotics may be prescribed if the condition comes back after treatment.  °HOME CARE INSTRUCTIONS  °· Only take over-the-counter or prescription medicines as  directed by your health care provider. °· If antibiotic medicine was prescribed, take it as directed. Make sure you finish it even if you start to feel better. °· Do not have sex until treatment is completed. °· Tell all sexual partners that you have a vaginal infection. They should see their health care provider and be treated if they have problems, such as a mild rash or itching. °· Practice safe sex by using condoms and only having one sex partner. °SEEK MEDICAL CARE IF:  °· Your symptoms are not improving after 3 days of treatment. °· You have increased discharge or pain. °· You have a fever. °MAKE SURE YOU:  °· Understand these instructions. °· Will watch your condition. °· Will get help right away if you are not doing well or get worse. °FOR MORE INFORMATION  °Centers for Disease Control and Prevention, Division of STD Prevention: www.cdc.gov/std °American Sexual Health Association (ASHA): www.ashastd.org  °Document Released: 03/05/2005 Document Revised: 12/24/2012 Document Reviewed: 10/15/2012 °ExitCare® Patient Information ©2015 [employer]. This information is not intended to replace advice given to you by your health care provider. Make sure you discuss any questions you have with your health care provider. ° °Urinary Tract Infection °A urinary tract infection (UTI) can occur any place along the urinary tract. The tract includes the kidneys, ureters, bladder, and urethra. A type of germ called bacteria often causes a UTI. UTIs are often helped with antibiotic medicine.  °HOME CARE  °· If given, take antibiotics as told by your doctor. Finish them even if you start to feel better. °· Drink enough fluids to keep your pee (urine) clear or pale yellow. °· Avoid tea, drinks with caffeine,   and bubbly (carbonated) drinks. °· Pee often. Avoid holding your pee in for a long time. °· Pee before and after having sex (intercourse). °· Wipe from front to back after you poop (bowel movement) if you are a woman. Use  each tissue only once. °GET HELP RIGHT AWAY IF:  °· You have back pain. °· You have lower belly (abdominal) pain. °· You have chills. °· You feel sick to your stomach (nauseous). °· You throw up (vomit). °· Your burning or discomfort with peeing does not go away. °· You have a fever. °· Your symptoms are not better in 3 days. °MAKE SURE YOU:  °· Understand these instructions. °· Will watch your condition. °· Will get help right away if you are not doing well or get worse. °Document Released: 08/22/2007 Document Revised: 11/28/2011 Document Reviewed: 10/04/2011 °ExitCare® Patient Information ©2015 [employer]. This information is not intended to replace advice given to you by your health care provider. Make sure you discuss any questions you have with your health care provider. ° °

## 2014-07-15 LAB — CULTURE, OB URINE
Colony Count: NO GROWTH
Culture: NO GROWTH

## 2014-07-16 ENCOUNTER — Ambulatory Visit (INDEPENDENT_AMBULATORY_CARE_PROVIDER_SITE_OTHER): Payer: 59 | Admitting: *Deleted

## 2014-07-16 ENCOUNTER — Ambulatory Visit (HOSPITAL_COMMUNITY)
Admission: RE | Admit: 2014-07-16 | Discharge: 2014-07-16 | Disposition: A | Discharge status: Home / Self Care | Payer: 59 | Source: Ambulatory Visit | Attending: Obstetrics and Gynecology | Admitting: Obstetrics and Gynecology

## 2014-07-16 VITALS — BP 130/84 | HR 96 | Wt 171.4 lb

## 2014-07-16 DIAGNOSIS — O289 Unspecified abnormal findings on antenatal screening of mother: Secondary | ICD-10-CM

## 2014-07-16 DIAGNOSIS — O99334 Smoking (tobacco) complicating childbirth: Secondary | ICD-10-CM | POA: Diagnosis present

## 2014-07-16 DIAGNOSIS — O480 Post-term pregnancy: Secondary | ICD-10-CM | POA: Insufficient documentation

## 2014-07-16 DIAGNOSIS — O283 Abnormal ultrasonic finding on antenatal screening of mother: Secondary | ICD-10-CM

## 2014-07-16 DIAGNOSIS — O288 Other abnormal findings on antenatal screening of mother: Secondary | ICD-10-CM | POA: Insufficient documentation

## 2014-07-16 DIAGNOSIS — F17210 Nicotine dependence, cigarettes, uncomplicated: Secondary | ICD-10-CM | POA: Diagnosis present

## 2014-07-16 DIAGNOSIS — O99824 Streptococcus B carrier state complicating childbirth: Secondary | ICD-10-CM | POA: Diagnosis present

## 2014-07-16 DIAGNOSIS — Z3A40 40 weeks gestation of pregnancy: Secondary | ICD-10-CM | POA: Insufficient documentation

## 2014-07-18 ENCOUNTER — Inpatient Hospital Stay (HOSPITAL_COMMUNITY)
Admission: AD | Admit: 2014-07-18 | Discharge: 2014-07-20 | DRG: 775 | Disposition: A | Discharge status: Home / Self Care | Payer: 59 | Source: Ambulatory Visit | Attending: Obstetrics and Gynecology | Admitting: Obstetrics and Gynecology

## 2014-07-18 ENCOUNTER — Inpatient Hospital Stay (HOSPITAL_COMMUNITY): Payer: 59 | Admitting: Anesthesiology

## 2014-07-18 ENCOUNTER — Encounter (HOSPITAL_COMMUNITY): Payer: Self-pay | Admitting: *Deleted

## 2014-07-18 DIAGNOSIS — Z3A40 40 weeks gestation of pregnancy: Secondary | ICD-10-CM | POA: Diagnosis present

## 2014-07-18 DIAGNOSIS — O99824 Streptococcus B carrier state complicating childbirth: Secondary | ICD-10-CM | POA: Diagnosis not present

## 2014-07-18 DIAGNOSIS — O4292 Full-term premature rupture of membranes, unspecified as to length of time between rupture and onset of labor: Secondary | ICD-10-CM | POA: Diagnosis not present

## 2014-07-18 DIAGNOSIS — O99334 Smoking (tobacco) complicating childbirth: Secondary | ICD-10-CM | POA: Diagnosis present

## 2014-07-18 DIAGNOSIS — O99820 Streptococcus B carrier state complicating pregnancy: Secondary | ICD-10-CM

## 2014-07-18 DIAGNOSIS — Z3493 Encounter for supervision of normal pregnancy, unspecified, third trimester: Secondary | ICD-10-CM

## 2014-07-18 DIAGNOSIS — O480 Post-term pregnancy: Secondary | ICD-10-CM | POA: Diagnosis present

## 2014-07-18 DIAGNOSIS — O4290 Premature rupture of membranes, unspecified as to length of time between rupture and onset of labor, unspecified weeks of gestation: Secondary | ICD-10-CM

## 2014-07-18 DIAGNOSIS — F17210 Nicotine dependence, cigarettes, uncomplicated: Secondary | ICD-10-CM | POA: Diagnosis present

## 2014-07-18 DIAGNOSIS — Z3483 Encounter for supervision of other normal pregnancy, third trimester: Secondary | ICD-10-CM | POA: Diagnosis present

## 2014-07-18 LAB — TYPE AND SCREEN
ABO/RH(D): O POS
ANTIBODY SCREEN: NEGATIVE

## 2014-07-18 LAB — POCT FERN TEST: POCT FERN TEST: POSITIVE

## 2014-07-18 LAB — CBC
HCT: 32.2 % — ABNORMAL LOW (ref 36.0–46.0)
Hemoglobin: 10.9 g/dL — ABNORMAL LOW (ref 12.0–15.0)
MCH: 30.5 pg (ref 26.0–34.0)
MCHC: 33.9 g/dL (ref 30.0–36.0)
MCV: 90.2 fL (ref 78.0–100.0)
PLATELETS: 430 10*3/uL — AB (ref 150–400)
RBC: 3.57 MIL/uL — ABNORMAL LOW (ref 3.87–5.11)
RDW: 14.1 % (ref 11.5–15.5)
WBC: 16.4 10*3/uL — ABNORMAL HIGH (ref 4.0–10.5)

## 2014-07-18 LAB — RPR: RPR Ser Ql: NONREACTIVE

## 2014-07-18 LAB — ABO/RH: ABO/RH(D): O POS

## 2014-07-18 MED ORDER — LIDOCAINE-EPINEPHRINE (PF) 2 %-1:200000 IJ SOLN
INTRAMUSCULAR | Status: DC | PRN
  Administered 2014-07-18: 3 mL

## 2014-07-18 MED ORDER — LIDOCAINE HCL (PF) 1 % IJ SOLN
30.0000 mL | INTRAMUSCULAR | Status: DC | PRN
  Filled 2014-07-18: qty 30

## 2014-07-18 MED ORDER — FENTANYL CITRATE (PF) 100 MCG/2ML IJ SOLN: 100.0000 ug | INTRAMUSCULAR | Status: DC | PRN

## 2014-07-18 MED ORDER — EPHEDRINE 5 MG/ML INJ
10.0000 mg | INTRAVENOUS | Status: DC | PRN
  Filled 2014-07-18: qty 2

## 2014-07-18 MED ORDER — ACETAMINOPHEN 325 MG PO TABS: 650.0000 mg | ORAL_TABLET | ORAL | Status: DC | PRN

## 2014-07-18 MED ORDER — DIPHENHYDRAMINE HCL 50 MG/ML IJ SOLN: 12.5000 mg | INTRAMUSCULAR | Status: DC | PRN

## 2014-07-18 MED ORDER — LACTATED RINGERS IV SOLN: 500.0000 mL | INTRAVENOUS | Status: DC | PRN

## 2014-07-18 MED ORDER — VANCOMYCIN HCL IN DEXTROSE 1-5 GM/200ML-% IV SOLN: 1000.0000 mg | Freq: Two times a day (BID) | INTRAVENOUS | Status: DC

## 2014-07-18 MED ORDER — PENICILLIN G POTASSIUM 5000000 UNITS IJ SOLR
2.5000 10*6.[IU] | INTRAVENOUS | Status: DC
  Administered 2014-07-18 (×4): 2.5 10*6.[IU] via INTRAVENOUS
  Filled 2014-07-18 (×9): qty 2.5

## 2014-07-18 MED ORDER — CITRIC ACID-SODIUM CITRATE 334-500 MG/5ML PO SOLN: 30.0000 mL | ORAL | Status: DC | PRN

## 2014-07-18 MED ORDER — OXYCODONE-ACETAMINOPHEN 5-325 MG PO TABS: 2.0000 | ORAL_TABLET | ORAL | Status: DC | PRN

## 2014-07-18 MED ORDER — LACTATED RINGERS IV SOLN
INTRAVENOUS | Status: DC
  Administered 2014-07-18 (×2): via INTRAVENOUS

## 2014-07-18 MED ORDER — PENICILLIN G POTASSIUM 5000000 UNITS IJ SOLR
5.0000 10*6.[IU] | Freq: Once | INTRAVENOUS | Status: AC
  Administered 2014-07-18: 5 10*6.[IU] via INTRAVENOUS
  Filled 2014-07-18: qty 5

## 2014-07-18 MED ORDER — BUPIVACAINE HCL (PF) 0.25 % IJ SOLN
INTRAMUSCULAR | Status: DC | PRN
  Administered 2014-07-18 (×2): 4 mL via EPIDURAL

## 2014-07-18 MED ORDER — FAMOTIDINE IN NACL 20-0.9 MG/50ML-% IV SOLN
20.0000 mg | Freq: Two times a day (BID) | INTRAVENOUS | Status: DC
  Administered 2014-07-18: 20 mg via INTRAVENOUS
  Filled 2014-07-18 (×2): qty 50

## 2014-07-18 MED ORDER — OXYTOCIN BOLUS FROM INFUSION
500.0000 mL | INTRAVENOUS | Status: DC
  Administered 2014-07-18: 500 mL via INTRAVENOUS

## 2014-07-18 MED ORDER — OXYTOCIN 40 UNITS IN LACTATED RINGERS INFUSION - SIMPLE MED
62.5000 mL/h | INTRAVENOUS | Status: DC
  Administered 2014-07-18: 62.5 mL/h via INTRAVENOUS

## 2014-07-18 MED ORDER — OXYTOCIN 40 UNITS IN LACTATED RINGERS INFUSION - SIMPLE MED
1.0000 m[IU]/min | INTRAVENOUS | Status: DC
  Administered 2014-07-18: 2 m[IU]/min via INTRAVENOUS
  Filled 2014-07-18: qty 1000

## 2014-07-18 MED ORDER — PHENYLEPHRINE 40 MCG/ML (10ML) SYRINGE FOR IV PUSH (FOR BLOOD PRESSURE SUPPORT)
80.0000 ug | PREFILLED_SYRINGE | INTRAVENOUS | Status: DC | PRN
  Filled 2014-07-18: qty 20
  Filled 2014-07-18: qty 2

## 2014-07-18 MED ORDER — TERBUTALINE SULFATE 1 MG/ML IJ SOLN: 0.2500 mg | Freq: Once | INTRAMUSCULAR | Status: AC | PRN

## 2014-07-18 MED ORDER — ONDANSETRON HCL 4 MG/2ML IJ SOLN: 4.0000 mg | Freq: Four times a day (QID) | INTRAMUSCULAR | Status: DC | PRN

## 2014-07-18 MED ORDER — FENTANYL 2.5 MCG/ML BUPIVACAINE 1/10 % EPIDURAL INFUSION (WH - ANES)
14.0000 mL/h | INTRAMUSCULAR | Status: DC | PRN
  Administered 2014-07-18: 12 mL/h via EPIDURAL
  Administered 2014-07-18: 14 mL/h via EPIDURAL
  Filled 2014-07-18 (×2): qty 125

## 2014-07-18 MED ORDER — OXYCODONE-ACETAMINOPHEN 5-325 MG PO TABS: 1.0000 | ORAL_TABLET | ORAL | Status: DC | PRN

## 2014-07-18 NOTE — MAU Note (Signed)
Had large gush fld about 2320. Cont to leak some cl fld. Leaked alittle yesterday as well but no large gush like tonight. Pelvic pressure

## 2014-07-18 NOTE — H&P (Signed)
LABOR ADMISSION HISTORY AND PHYSICAL  Catherine SchilderMichelle L Ruiz is a 24 y.o. female G2P0010 with IUP at 5723w4d by LMP c/w 58106w2d sono presenting for rupture of membranes. She reports +FMs, No LOF, no VB, no blurry vision, headaches or peripheral edema, and RUQ pain.  She plans on bottle feeding. She declines birth control.  Dating: By LMP c/w 57106w2d --->  Estimated Date of Delivery: 07/14/14  Prenatal History/Complications:  Past Medical History: Past Medical History  Diagnosis Date  . Ruptured spleen   . Miscarriage   . GERD (gastroesophageal reflux disease)     Past Surgical History: Past Surgical History  Procedure Laterality Date  . Appendectomy    . Mouth surgery      Obstetrical History: OB History    Gravida Para Term Preterm AB TAB SAB Ectopic Multiple Living   2    1  1    0      Social History: History   Social History  . Marital Status: Married    Spouse Name: N/A  . Number of Children: N/A  . Years of Education: N/A   Social History Main Topics  . Smoking status: Current Every Day Smoker -- 1.00 packs/day for 2 years  . Smokeless tobacco: Current User     Comment: cutting back/trying to quit  . Alcohol Use: No  . Drug Use: No  . Sexual Activity: Yes    Birth Control/ Protection: Condom     Comment: pregnant   Other Topics Concern  . None   Social History Narrative   Patient is engaged and will be married next year.   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   [place] in [place] her [place]iance.    Family History: Family History  Problem Relation Age of Onset  . Asthma Sister   . Asthma Brother     Allergies: Allergies  Allergen Reactions  . Aspirin Nausea Only  . Keflex [Cephalexin] Nausea And Vomiting  . Penicillins Nausea And Vomiting  . Sulfa Antibiotics Rash    Prescriptions prior to admission  Medication Sig Dispense Refill Last Dose  . metroNIDAZOLE (FLAGYL) 500 MG tablet Take 1 tablet (500 mg total) by mouth 2 (two) times  daily. 14 tablet 0 07/17/2014 at Unknown time  . nitrofurantoin, macrocrystal-monohydrate, (MACROBID) 100 MG capsule Take 1 capsule (100 mg total) by mouth 2 (two) times daily. 14 capsule 0 07/17/2014 at Unknown time  . Prenatal Multivit-Min-Fe-FA (PRENATAL VITAMINS PO) Take 1 tablet by mouth daily.    07/17/2014 at Unknown time  . ranitidine (ZANTAC) 150 MG tablet Take 150 mg by mouth 2 (two) times daily as needed for heartburn.    07/17/2014 at Unknown time  . azithromycin (ZITHROMAX) 250 MG tablet   0      Review of Systems   All systems reviewed and negative except as stated in HPI  Blood pressure 124/74, pulse 96, temperature 98.1 F (36.7 C), resp. rate 20, height 5\' 4"  (1.626 m), weight 167 lb 12.8 oz (76.114 kg), last menstrual period 10/07/2013. General appearance: alert, cooperative and no distress, very comfortable Lungs: clear to auscultation bilaterally Heart: regular rate and rhythm Abdomen: soft, non-tender; bowel sounds normal Extremities: Homans sign is negative, no sign of DVT     Prenatal labs: ABO, Rh: O/POS/-- (09/16 0947) Antibody: NEG (09/16 0947) Rubella:   RPR: NON REAC (02/04 0832)  HBsAg: NEGATIVE (09/16 0947)  HIV: NONREACTIVE (02/04 0832)  GBS: Positive (03/30 0000)  1 hr Glucola  73 Genetic screening  declined Anatomy US normal  Prenatal Transfer Tool  Maternal Diabetes: No Genetic Screening: Declined Maternal Ultrasounds/Referrals: Normal Fetal Ultrasounds or other Referrals:  None Maternal Substance Abuse:  No Significant Maternal Medications:  None Significant Maternal Lab Results: Lab values include: Group B Strep positive   Clinic  [facility]  Dating  LMP c/w 1st trimester ultrasound  Genetic Screen declined  Anatomic [place]  normal but incomplete views of the heart  GTT Third trimester: 72  TDaP vaccine 04/22/14  Flu vaccine Declines  GBS Pos  Contraception Declines any contraception "I will use my own"  Baby Food  bottle  Circumcision  N/A  Pediatrician [facility]  Support Person FOB     Results for orders placed or performed during the hospital encounter of 07/18/14 (from the past 24 hour(s))  Fern Test   Collection Time: 07/18/14  1:14 AM  Result Value Ref Range   POCT Fern Test Positive = ruptured amniotic membanes     Patient Active Problem List   Diagnosis Date Noted  . Non-reactive NST (non-stress test)   . [redacted] weeks gestation of pregnancy   . Post-term pregnancy, 40-42 weeks of gestation   . Group B Streptococcus carrier, +RV culture, currently pregnant 06/18/2014  . Supervision of normal pregnancy 12/02/2013  . Tobacco abuse 07/28/2012    Assessment: Catherine Ruiz is a 24 y.o. G2P0010 at [redacted]w[redacted]d here for rupture of memrbanes  #Labor:will likely need pitocin, last cervical exam 3-4/70/-2, once transferred to L&D will check and start pitocin #Pain: Requests natural delivery #FWB: Cat I #ID:  GBS + with PCN allergy => vancomycin #MOF: bottle #MOC:declines #Circ:  n/a  Catherine Ruiz Catherine Ruiz 07/18/2014, 1:19 AM

## 2014-07-18 NOTE — MAU Note (Signed)
[facility] for [facility] to come to [facility]

## 2014-07-18 NOTE — Progress Notes (Signed)
Adele SchilderMichelle L Geraghty is a 24 y.o. G2P0010 at 644w4d by LMP admitted for spontaneous rupture of membranes  Subjective: Patient reports that she is feeling more pressure since the last time we spoke.  Pressure is in her back.    Objective: BP 91/67 mmHg  Pulse 99  Temp(Src) 98.7 F (37.1 C) (Oral)  Resp 18  Ht 5\' 4"  (1.626 m)  Wt 167 lb 12.8 oz (76.114 kg)  BMI 28.79 kg/m2  SpO2 97%  LMP 10/07/2013     FHT:  FHR: 140 bpm, variability: moderate,  accelerations:  Present,  decelerations:  Absent UC:   regular, every 1-3 minutes SVE:   Dilation: 7.5 Effacement (%): 90 Station: +2 Exam by:: j.thornton, rn  Labs: Lab Results  Component Value Date   WBC 16.4* 07/18/2014   HGB 10.9* 07/18/2014   HCT 32.2* 07/18/2014   MCV 90.2 07/18/2014   PLT 430* 07/18/2014    Assessment / Plan: Augmentation of labor, progressing well  Labor: Progressing normally Fetal Wellbeing:  Category I Pain Control:  Epidural I/D:  GBS+, PCN Anticipated MOD:  NSVD  Raliegh IpGottschalk, Yaquelin Langelier M, DO 07/18/2014, 4:11 PM

## 2014-07-18 NOTE — Progress Notes (Signed)
Adele SchilderMichelle L Conger is a 24 y.o. G2P0010 at 885w4d by LMP admitted for rupture of membranes  Subjective: Patient reports that she is doing well since placement of epidural.  She reports pressure with contractions but otherwise voices no concerns at this time.  Objective: BP 101/65 mmHg  Pulse 96  Temp(Src) 98.1 F (36.7 C) (Oral)  Resp 18  Ht 5\' 4"  (1.626 m)  Wt 167 lb 12.8 oz (76.114 kg)  BMI 28.79 kg/m2  SpO2 97%  LMP 10/07/2013     FHT:  FHR: 130 bpm, variability: moderate,  accelerations:  Present,  decelerations:  Absent UC:   regular, every 1-2 minutes SVE:   Dilation: 5 Effacement (%): 80 Station: 0 Exam by:: J.Thornton, RN   Labs: Lab Results  Component Value Date   WBC 16.4* 07/18/2014   HGB 10.9* 07/18/2014   HCT 32.2* 07/18/2014   MCV 90.2 07/18/2014   PLT 430* 07/18/2014    Assessment / Plan: Augmentation of labor, progressing well  Labor: Progressing normally Fetal Wellbeing:  Category I Pain Control:  Epidural I/D:  GBS+, PCN Anticipated MOD:  NSVD  Raliegh IpGottschalk, Journe Hallmark M, DO 07/18/2014, 1:05 PM

## 2014-07-18 NOTE — Anesthesia Preprocedure Evaluation (Signed)
Anesthesia Evaluation  Patient identified by MRN, date of birth, ID band Patient awake    Reviewed: Allergy & Precautions, NPO status , Patient's Chart, lab work & pertinent test results  History of Anesthesia Complications Negative for: history of anesthetic complications  Airway Mallampati: II  TM Distance: >3 FB Neck ROM: Full    Dental  (+) Teeth Intact   Pulmonary neg shortness of breath, neg sleep apnea, neg COPDneg recent URI, former smoker,  breath sounds clear to auscultation        Cardiovascular negative cardio ROS  Rhythm:Regular     Neuro/Psych negative neurological ROS  negative psych ROS   GI/Hepatic Neg liver ROS, GERD-  Medicated and Controlled,  Endo/Other  negative endocrine ROS  Renal/GU negative Renal ROS     Musculoskeletal   Abdominal   Peds  Hematology negative hematology ROS (+)   Anesthesia Other Findings   Reproductive/Obstetrics (+) Pregnancy                             Anesthesia Physical Anesthesia Plan  ASA: II  Anesthesia Plan: Epidural   Post-op Pain Management:    Induction:   Airway Management Planned:   Additional Equipment:   Intra-op Plan:   Post-operative Plan:   Informed Consent: I have reviewed the patients History and Physical, chart, labs and discussed the procedure including the risks, benefits and alternatives for the proposed anesthesia with the patient or authorized representative who has indicated his/her understanding and acceptance.     Plan Discussed with: Anesthesiologist  Anesthesia Plan Comments:         Anesthesia Quick Evaluation

## 2014-07-18 NOTE — Progress Notes (Signed)
Catherine Ruiz is a 24 y.o. G2P0010 at 6736w4d by LMP admitted for rupture of membranes  Subjective: Patient endorses increasing discomfort with contractions and increased pelvic pressure  Objective: BP 139/67 mmHg  Pulse 91  Temp(Src) 98.7 F (37.1 C) (Oral)  Resp 20  Ht 5\' 4"  (1.626 m)  Wt 167 lb 12.8 oz (76.114 kg)  BMI 28.79 kg/m2  LMP 10/07/2013     FHT:  FHR: 145 bpm, variability: moderate,  accelerations:  Present,  decelerations:  Absent UC:   regular, every 2-4 minutes SVE:   Dilation: 4.5 Effacement (%): 80 Station: 0 Exam by:: J.Thornton, RN  Labs: Lab Results  Component Value Date   WBC 16.4* 07/18/2014   HGB 10.9* 07/18/2014   HCT 32.2* 07/18/2014   MCV 90.2 07/18/2014   PLT 430* 07/18/2014    Assessment / Plan: Spontaneous labor, progressing normally  Labor: Progressing normally Fetal Wellbeing:  Category I Pain Control:  Labor support without medications I/D:  GBS+, PCN Anticipated MOD:  NSVD  Raliegh IpGottschalk, Ashly M, DO 07/18/2014, 9:26 AM

## 2014-07-18 NOTE — Anesthesia Procedure Notes (Signed)
Epidural Patient location during procedure: OB  Staffing Anesthesiologist: Florita Nitsch, CHRIS Performed by: anesthesiologist   Preanesthetic Checklist Completed: patient identified, surgical consent, pre-op evaluation, timeout performed, IV checked, risks and benefits discussed and monitors and equipment checked  Epidural Patient position: sitting Prep: site prepped and draped and DuraPrep Patient monitoring: heart rate, cardiac monitor, continuous pulse ox and blood pressure Approach: midline Location: L3-L4 Injection technique: LOR saline  Needle:  Needle type: Tuohy  Needle gauge: 17 G Needle length: 9 cm Needle insertion depth: 7 cm Catheter type: closed end flexible Catheter size: 19 Gauge Catheter at skin depth: 12 cm Test dose: negative and 2% lidocaine with Epi 1:200 K  Assessment Events: blood not aspirated, injection not painful, no injection resistance, negative IV test and no paresthesia  Additional Notes H+P and labs checked, risks and benefits discussed with the patient, consent obtained, procedure tolerated well and without complications.  Reason for block:procedure for pain   

## 2014-07-18 NOTE — Progress Notes (Signed)
Dr Loreta AveAcosta in to see pt and discuss admission and plan of care. Will wait to ck cerivx in BS

## 2014-07-19 ENCOUNTER — Encounter (HOSPITAL_COMMUNITY): Payer: Self-pay | Admitting: *Deleted

## 2014-07-19 LAB — HIV ANTIBODY (ROUTINE TESTING W REFLEX): HIV Screen 4th Generation wRfx: NONREACTIVE

## 2014-07-19 MED ORDER — ONDANSETRON HCL 4 MG/2ML IJ SOLN: 4.0000 mg | INTRAMUSCULAR | Status: DC | PRN

## 2014-07-19 MED ORDER — SODIUM CHLORIDE 0.9 % IV SOLN: 250.0000 mL | INTRAVENOUS | Status: DC | PRN

## 2014-07-19 MED ORDER — ZOLPIDEM TARTRATE 5 MG PO TABS: 5.0000 mg | ORAL_TABLET | Freq: Every evening | ORAL | Status: DC | PRN

## 2014-07-19 MED ORDER — PNEUMOCOCCAL VAC POLYVALENT 25 MCG/0.5ML IJ INJ
0.5000 mL | INJECTION | INTRAMUSCULAR | Status: AC
  Administered 2014-07-20: 0.5 mL via INTRAMUSCULAR
  Filled 2014-07-19: qty 0.5

## 2014-07-19 MED ORDER — DIPHENHYDRAMINE HCL 25 MG PO CAPS: 25.0000 mg | ORAL_CAPSULE | Freq: Four times a day (QID) | ORAL | Status: DC | PRN

## 2014-07-19 MED ORDER — OXYCODONE-ACETAMINOPHEN 5-325 MG PO TABS: 1.0000 | ORAL_TABLET | ORAL | Status: DC | PRN

## 2014-07-19 MED ORDER — SODIUM CHLORIDE 0.9 % IJ SOLN: 3.0000 mL | Freq: Two times a day (BID) | INTRAMUSCULAR | Status: DC

## 2014-07-19 MED ORDER — BENZOCAINE-MENTHOL 20-0.5 % EX AERO: 1.0000 "application " | INHALATION_SPRAY | CUTANEOUS | Status: DC | PRN

## 2014-07-19 MED ORDER — ONDANSETRON HCL 4 MG PO TABS: 4.0000 mg | ORAL_TABLET | ORAL | Status: DC | PRN

## 2014-07-19 MED ORDER — LANOLIN HYDROUS EX OINT: TOPICAL_OINTMENT | CUTANEOUS | Status: DC | PRN

## 2014-07-19 MED ORDER — SIMETHICONE 80 MG PO CHEW: 80.0000 mg | CHEWABLE_TABLET | ORAL | Status: DC | PRN

## 2014-07-19 MED ORDER — DIBUCAINE 1 % RE OINT: 1.0000 "application " | TOPICAL_OINTMENT | RECTAL | Status: DC | PRN

## 2014-07-19 MED ORDER — TETANUS-DIPHTH-ACELL PERTUSSIS 5-2.5-18.5 LF-MCG/0.5 IM SUSP: 0.5000 mL | Freq: Once | INTRAMUSCULAR | Status: DC

## 2014-07-19 MED ORDER — WITCH HAZEL-GLYCERIN EX PADS: 1.0000 "application " | MEDICATED_PAD | CUTANEOUS | Status: DC | PRN

## 2014-07-19 MED ORDER — OXYCODONE-ACETAMINOPHEN 5-325 MG PO TABS: 2.0000 | ORAL_TABLET | ORAL | Status: DC | PRN

## 2014-07-19 MED ORDER — SENNOSIDES-DOCUSATE SODIUM 8.6-50 MG PO TABS
2.0000 | ORAL_TABLET | ORAL | Status: DC
  Administered 2014-07-19: 2 via ORAL
  Filled 2014-07-19: qty 2

## 2014-07-19 MED ORDER — ACETAMINOPHEN 325 MG PO TABS: 650.0000 mg | ORAL_TABLET | ORAL | Status: DC | PRN

## 2014-07-19 MED ORDER — OXYTOCIN 40 UNITS IN LACTATED RINGERS INFUSION - SIMPLE MED
62.5000 mL/h | INTRAVENOUS | Status: DC | PRN
Start: 2014-07-19 — End: 2014-07-20

## 2014-07-19 MED ORDER — IBUPROFEN 600 MG PO TABS
600.0000 mg | ORAL_TABLET | Freq: Four times a day (QID) | ORAL | Status: DC
  Administered 2014-07-19 – 2014-07-20 (×6): 600 mg via ORAL
  Filled 2014-07-19 (×6): qty 1

## 2014-07-19 MED ORDER — PRENATAL MULTIVITAMIN CH
1.0000 | ORAL_TABLET | Freq: Every day | ORAL | Status: DC
  Administered 2014-07-19: 1 via ORAL
  Filled 2014-07-19: qty 1

## 2014-07-19 MED ORDER — SODIUM CHLORIDE 0.9 % IJ SOLN: 3.0000 mL | INTRAMUSCULAR | Status: DC | PRN

## 2014-07-19 NOTE — Anesthesia Postprocedure Evaluation (Signed)
  Anesthesia Post-op Note  Patient: Catherine SchilderMichelle L Nikkel  Procedure(s) Performed: * No procedures listed *  Patient Location: PACU and Mother/Baby  Anesthesia Type:Epidural  Level of Consciousness: awake, alert  and oriented  Airway and Oxygen Therapy: Patient Spontanous Breathing  Post-op Pain: none  Post-op Assessment: Post-op Vital signs reviewed and Patient's Cardiovascular Status Stable  Post-op Vital Signs: Reviewed and stable  Last Vitals:  Filed Vitals:   07/19/14 0620  BP: 105/71  Pulse: 75  Temp: 36.8 C  Resp: 18    Complications: No apparent anesthesia complications

## 2014-07-20 MED ORDER — IBUPROFEN 600 MG PO TABS: 600.0000 mg | ORAL_TABLET | Freq: Four times a day (QID) | ORAL | Status: DC

## 2014-07-20 MED ORDER — MEASLES, MUMPS & RUBELLA VAC ~~LOC~~ INJ
0.5000 mL | INJECTION | Freq: Once | SUBCUTANEOUS | Status: AC
  Administered 2014-07-20: 0.5 mL via SUBCUTANEOUS
  Filled 2014-07-20: qty 0.5

## 2014-07-20 NOTE — Discharge Summary (Signed)
Obstetric Discharge Summary Reason for Admission: onset of labor Prenatal Procedures: ultrasound Intrapartum Procedures: vacuum and GBS prophylaxis Postpartum Procedures: none Complications-Operative and Postpartum: none  Patient is 24 y.o. [id number] 2727w4d admitted in active labor, GBS positive started on PCN for ppx, no true PCN allergy as she reported PCN allergy was throwing up   Delivery Note At 10:52 PM a viable female was delivered via Vaginal, Vacuum (Extractor) (Presentation: ; Occiput Anterior). APGAR: 9, 9; weight 7 lb 13 oz (3545 g).  Placenta status: Intact, Spontaneous. Cord: 3 vessels with the following complications: None.   Anesthesia: Epidural  Episiotomy: None Lacerations: None Suture Repair: n/a Est. Blood Loss (mL): 139  Mom to postpartum. Baby to Couplet care / Skin to Skin.  Hospital Course:  Active Problems:   Premature rupture of membranes  Catherine SchilderMichelle L Ruiz is a 24 y.o. G2P1011 s/p VAVD.  Patient was admitted SOL.  She had a postpartum course that was uncomplicated including no problems with ambulating, PO intake, urination, pain, or bleeding. The patient feels ready to go home and will be discharged with outpatient follow-up.   Today: No acute events overnight.  Pt denies problems with ambulating, voiding or po intake.  She denies nausea or vomiting.  Pain is well controlled.  She has had flatus. She has not had bowel movement.  Lochia Small.  Plan for birth control is no method.  Method of Feeding: Bottle  Physical Exam:  Blood pressure 102/66, pulse 81, temperature 98.2 F (36.8 C), temperature source Oral, resp. rate 16, height 5\' 4"  (1.626 m), weight 167 lb 12.8 oz (76.114 kg), last menstrual period 10/07/2013, SpO2 98 %, unknown if currently breastfeeding. General: alert, cooperative, appears stated age and no distress  Cardio: RRR, no murmurs Pulm: CTAB, no increased WOB Lochia: appropriate Uterine Fundus: firm DVT Evaluation: No evidence of  DVT seen on physical exam. Negative Homan's sign. No cords or calf tenderness. No significant calf/ankle edema.  H/H: Lab Results  Component Value Date/Time   HGB 10.9* 07/18/2014 01:40 AM   HCT 32.2* 07/18/2014 01:40 AM    Discharge Diagnoses: Term Pregnancy-delivered  Discharge Information: Date: 07/20/2014 Activity: pelvic rest Diet: routine  Medications: PNV and Ibuprofen Breast feeding:  No Condition: stable Instructions: refer to handout Discharge to: home    Medication List    STOP taking these medications        metroNIDAZOLE 500 MG tablet  Commonly known as:  FLAGYL     nitrofurantoin (macrocrystal-monohydrate) 100 MG capsule  Commonly known as:  MACROBID      TAKE these medications        ibuprofen 600 MG tablet  Commonly known as:  ADVIL,MOTRIN  Take 1 tablet (600 mg total) by mouth every 6 (six) hours.     PRENATAL VITAMINS PO  Take 1 tablet by mouth daily.     ranitidine 150 MG tablet  Commonly known as:  ZANTAC  Take 150 mg by mouth 2 (two) times daily as needed for heartburn.       Follow-up Information    [facility] Schedule an appointment as soon as possible for a visit in 6 weeks.   Specialty:  Obstetrics and Gynecology   Why:  post partum follow up   Contact information:   [address] [place] [phone]      Raliegh IpGottschalk, Ashly M, DO [facility], PGY-1 07/20/2014,8:41 AM  CNM attestation I have  seen and examined this patient and agree with above documentation in the resident's note.   Catherine Ruiz is a 24 y.o. G2P1011 s/p vac delivery.   Pain is well controlled.  Plan for birth control is no method.  Method of Feeding: bottle  PE:  BP 102/66 mmHg  Pulse 81  Temp(Src) 98.2 F (36.8 C) (Oral)  Resp 16  Ht  (1.626 m)  Wt 76.114 kg (167 lb 12.8 oz)  BMI 28.79 kg/m2  SpO2 98%  LMP 10/07/2013  Breastfeeding? Unknown Fundus  firm   Recent Labs  07/18/14 0140  HGB 10.9*  HCT 32.2*     Plan: discharge today - postpartum care discussed - f/u clinic in 6 weeks for postpartum visit   Dharma Pare, CNM 8:57 AM

## 2014-07-20 NOTE — Discharge Instructions (Signed)

## 2014-07-21 ENCOUNTER — Inpatient Hospital Stay (HOSPITAL_COMMUNITY): Admission: RE | Admit: 2014-07-21 | Payer: 59 | Source: Ambulatory Visit

## 2014-08-03 ENCOUNTER — Encounter: Payer: Self-pay | Admitting: Emergency Medicine

## 2014-08-03 ENCOUNTER — Emergency Department
Admission: EM | Admit: 2014-08-03 | Discharge: 2014-08-03 | Discharge status: Against Medical Advice | Payer: 59 | Attending: Emergency Medicine | Admitting: Emergency Medicine

## 2014-08-03 DIAGNOSIS — M545 Low back pain: Secondary | ICD-10-CM | POA: Diagnosis present

## 2014-08-03 DIAGNOSIS — Z87891 Personal history of nicotine dependence: Secondary | ICD-10-CM | POA: Diagnosis not present

## 2014-08-03 NOTE — ED Notes (Addendum)
Patient ambulatory to triage with steady gait, without difficulty or distress noted; pt reports lower back pain tonight; denies any injury; denies urinary c/o; denies hx of same; st recent vag delivery with epidural 5/1

## 2014-08-04 ENCOUNTER — Telehealth: Payer: Self-pay | Admitting: Emergency Medicine

## 2014-08-09 ENCOUNTER — Encounter (HOSPITAL_COMMUNITY): Payer: Self-pay | Admitting: *Deleted

## 2014-08-09 ENCOUNTER — Inpatient Hospital Stay (HOSPITAL_COMMUNITY)
Admission: AD | Admit: 2014-08-09 | Discharge: 2014-08-09 | Disposition: A | Discharge status: Home / Self Care | Payer: 59 | Source: Ambulatory Visit | Attending: Obstetrics & Gynecology | Admitting: Obstetrics & Gynecology

## 2014-08-09 DIAGNOSIS — O9089 Other complications of the puerperium, not elsewhere classified: Secondary | ICD-10-CM | POA: Diagnosis not present

## 2014-08-09 DIAGNOSIS — Z87891 Personal history of nicotine dependence: Secondary | ICD-10-CM | POA: Diagnosis not present

## 2014-08-09 DIAGNOSIS — M62830 Muscle spasm of back: Secondary | ICD-10-CM | POA: Insufficient documentation

## 2014-08-09 DIAGNOSIS — M549 Dorsalgia, unspecified: Secondary | ICD-10-CM | POA: Diagnosis present

## 2014-08-09 HISTORY — DX: Urinary tract infection, site not specified: N39.0

## 2014-08-09 LAB — URINALYSIS, ROUTINE W REFLEX MICROSCOPIC
Bilirubin Urine: NEGATIVE
Glucose, UA: NEGATIVE mg/dL
KETONES UR: NEGATIVE mg/dL
Leukocytes, UA: NEGATIVE
Nitrite: NEGATIVE
PH: 6 (ref 5.0–8.0)
Protein, ur: NEGATIVE mg/dL
Specific Gravity, Urine: 1.025 (ref 1.005–1.030)
UROBILINOGEN UA: 0.2 mg/dL (ref 0.0–1.0)

## 2014-08-09 LAB — URINE MICROSCOPIC-ADD ON

## 2014-08-09 MED ORDER — CYCLOBENZAPRINE HCL 10 MG PO TABS: 10.0000 mg | ORAL_TABLET | Freq: Three times a day (TID) | ORAL | Status: DC | PRN

## 2014-08-09 NOTE — Discharge Instructions (Signed)

## 2014-08-09 NOTE — MAU Note (Signed)
Delivered vag May 1, started having random back pain 05/17, again on the21st and today.  Pain lasts about 45 ming, low back radiating all the way up to neck. Takes her breath away, then she throws up, will stop briefly after she throws up, then returns- but not as bad.  Husband states she passed out on Sat when it occurred.  Called dr today, and was told to come here.

## 2014-08-09 NOTE — MAU Provider Note (Signed)
  History   pt is s/p delivery on Jul 18 2014 and when she is riding in care for any length of time starts to have back pain that is high lumbar in nature states pain starts then starts to have spasms that takes her breath.  CSN: [id number]  Arrival date and time: 08/09/14 1637   None     Chief Complaint  Patient presents with  . Back Pain   HPI  OB History    Gravida Para Term Preterm AB TAB SAB Ectopic Multiple Living   2 1 1  1  1   0 1      Past Medical History  Diagnosis Date  . Ruptured spleen   . Miscarriage   . GERD (gastroesophageal reflux disease)   . UTI (lower urinary tract infection)     Past Surgical History  Procedure Laterality Date  . Appendectomy    . Mouth surgery    . Appendectomy  2001    Family History  Problem Relation Age of Onset  . Asthma Sister   . Asthma Brother     History  Substance Use Topics  . Smoking status: Former Smoker -- 1.00 packs/day for 2 years    Quit date: 10/17/2013  . Smokeless tobacco: Never Used     Comment: cutting back/trying to quit  . Alcohol Use: No    Allergies:  Allergies  Allergen Reactions  . Aspirin Nausea Only  . Keflex [Cephalexin] Nausea And Vomiting  . Penicillins Nausea And Vomiting  . Sulfa Antibiotics Rash    Prescriptions prior to admission  Medication Sig Dispense Refill Last Dose  . ibuprofen (ADVIL,MOTRIN) 600 MG tablet Take 1 tablet (600 mg total) by mouth every 6 (six) hours. 30 tablet 0 Past Month at Unknown time  . Prenatal Multivit-Min-Fe-FA (PRENATAL VITAMINS PO) Take 1 tablet by mouth daily.    Past Week at Unknown time  . ranitidine (ZANTAC) 150 MG tablet Take 150 mg by mouth 2 (two) times daily as needed for heartburn.    Past Month at Unknown time    Review of Systems  Constitutional: Negative.   HENT: Negative.   Eyes: Negative.   Respiratory: Negative.   Cardiovascular: Negative.   Gastrointestinal: Negative.   Genitourinary: Negative.   Musculoskeletal: Positive for  back pain.  Skin: Negative.   Neurological: Negative.   Endo/Heme/Allergies: Negative.   Psychiatric/Behavioral: Negative.    Physical Exam   Blood pressure 116/72, pulse 80, temperature 98.6 F (37 C), temperature source Oral, resp. rate 16, not currently breastfeeding.  Physical Exam  Constitutional: She is oriented to person, place, and time. She appears well-developed and well-nourished.  HENT:  Head: Normocephalic.  Eyes: Pupils are equal, round, and reactive to light.  Neck: Normal range of motion.  Cardiovascular: Normal rate, regular rhythm, normal heart sounds and intact distal pulses.   Respiratory: Effort normal and breath sounds normal.  GI: Soft. Bowel sounds are normal.  Genitourinary: Vagina normal.  Musculoskeletal: Normal range of motion.  Neurological: She is alert and oriented to person, place, and time. She has normal reflexes.  Skin: Skin is warm and dry.  Psychiatric: She has a normal mood and affect. Her behavior is normal. Judgment and thought content normal.    MAU Course  Procedures  MDM Back spams   Assessment and Plan  Back spasms will start on flexiril and d/c home  LAWSON, MARIE DARLENE 08/09/2014, 6:08 PM

## 2014-08-26 ENCOUNTER — Encounter: Payer: Self-pay | Admitting: Obstetrics & Gynecology

## 2014-08-26 ENCOUNTER — Ambulatory Visit (INDEPENDENT_AMBULATORY_CARE_PROVIDER_SITE_OTHER): Payer: 59 | Admitting: Obstetrics & Gynecology

## 2014-08-26 DIAGNOSIS — Z3041 Encounter for surveillance of contraceptive pills: Secondary | ICD-10-CM

## 2014-08-26 MED ORDER — NORGESTREL-ETHINYL ESTRADIOL 0.3-30 MG-MCG PO TABS
1.0000 | ORAL_TABLET | Freq: Every day | ORAL | Status: DC
Start: 2014-08-26 — End: 2015-08-25

## 2014-08-26 NOTE — Progress Notes (Signed)
  Subjective:     Catherine Ruiz is a 24 y.o. female who presents for a postpartum visit. She is 6 weeks postpartum following a spontaneous vaginal delivery. I have fully reviewed the prenatal and intrapartum course. The delivery was at 40 gestational weeks. Outcome: vacuum, outlet. Anesthesia: epidural. Postpartum course has been normal. Baby's course has been normal. Baby is feeding by bottle - [employer] Good Start. Bleeding staining only. Bowel function is normal. Bladder function is normal. Patient is not sexually active. Contraception method is OCP (estrogen/progesterone). Postpartum depression screening: negative.  The following portions of the patient's history were reviewed and updated as appropriate: allergies, current medications, past family history, past medical history, past social history, past surgical history and problem list.  Review of Systems Pertinent items are noted in HPI.   Objective:    BP 108/76 mmHg  Pulse 87  Ht 5\' 4"  (1.626 m)  Wt 156 lb 3.2 oz (70.852 kg)  BMI 26.80 kg/m2  Breastfeeding? No  General:  alert   Breasts:  inspection negative, no nipple discharge or bleeding, no masses or nodularity palpable  Lungs: clear to auscultation bilaterally  Heart:  regular rate and rhythm, S1, S2 normal, no murmur, click, rub or gallop  Abdomen: soft, non-tender; bowel sounds normal; no masses,  no organomegaly   Vulva:  normal  Vagina: normal vagina  Cervix:  not examined  Corpus: not examined  Adnexa:  not evaluated  Rectal Exam: Not performed.        Assessment:     Normal postpartum exam. Pap smear not done at today's visit.   Plan:    1. Contraception: OCP (estrogen/progesterone) 3. Follow up in: 1 year or as needed.

## 2014-10-11 ENCOUNTER — Encounter (HOSPITAL_COMMUNITY): Payer: Self-pay | Admitting: Emergency Medicine

## 2014-10-11 ENCOUNTER — Emergency Department (HOSPITAL_COMMUNITY): Payer: 59

## 2014-10-11 ENCOUNTER — Inpatient Hospital Stay (HOSPITAL_COMMUNITY)
Admission: EM | Admit: 2014-10-11 | Discharge: 2014-10-12 | DRG: 419 | Disposition: A | Discharge status: Home / Self Care | Payer: 59 | Attending: General Surgery | Admitting: General Surgery

## 2014-10-11 DIAGNOSIS — Z720 Tobacco use: Secondary | ICD-10-CM | POA: Diagnosis present

## 2014-10-11 DIAGNOSIS — Z886 Allergy status to analgesic agent status: Secondary | ICD-10-CM

## 2014-10-11 DIAGNOSIS — F17210 Nicotine dependence, cigarettes, uncomplicated: Secondary | ICD-10-CM | POA: Diagnosis present

## 2014-10-11 DIAGNOSIS — K858 Other acute pancreatitis without necrosis or infection: Secondary | ICD-10-CM

## 2014-10-11 DIAGNOSIS — Z791 Long term (current) use of non-steroidal anti-inflammatories (NSAID): Secondary | ICD-10-CM

## 2014-10-11 DIAGNOSIS — Z880 Allergy status to penicillin: Secondary | ICD-10-CM | POA: Diagnosis not present

## 2014-10-11 DIAGNOSIS — K851 Biliary acute pancreatitis without necrosis or infection: Secondary | ICD-10-CM | POA: Diagnosis present

## 2014-10-11 DIAGNOSIS — K8070 Calculus of gallbladder and bile duct without cholecystitis without obstruction: Secondary | ICD-10-CM | POA: Diagnosis present

## 2014-10-11 DIAGNOSIS — K219 Gastro-esophageal reflux disease without esophagitis: Secondary | ICD-10-CM | POA: Diagnosis present

## 2014-10-11 DIAGNOSIS — Z793 Long term (current) use of hormonal contraceptives: Secondary | ICD-10-CM

## 2014-10-11 DIAGNOSIS — Z881 Allergy status to other antibiotic agents status: Secondary | ICD-10-CM

## 2014-10-11 DIAGNOSIS — Z882 Allergy status to sulfonamides status: Secondary | ICD-10-CM

## 2014-10-11 DIAGNOSIS — K8043 Calculus of bile duct with acute cholecystitis with obstruction: Secondary | ICD-10-CM

## 2014-10-11 DIAGNOSIS — Z79899 Other long term (current) drug therapy: Secondary | ICD-10-CM | POA: Diagnosis not present

## 2014-10-11 DIAGNOSIS — R109 Unspecified abdominal pain: Secondary | ICD-10-CM | POA: Diagnosis present

## 2014-10-11 HISTORY — DX: Other specified postprocedural states: Z98.890

## 2014-10-11 HISTORY — DX: Nausea with vomiting, unspecified: R11.2

## 2014-10-11 LAB — COMPREHENSIVE METABOLIC PANEL
ALT: 68 U/L — ABNORMAL HIGH (ref 14–54)
ANION GAP: 5 (ref 5–15)
AST: 139 U/L — ABNORMAL HIGH (ref 15–41)
Albumin: 3.5 g/dL (ref 3.5–5.0)
Alkaline Phosphatase: 103 U/L (ref 38–126)
BUN: 11 mg/dL (ref 6–20)
CHLORIDE: 106 mmol/L (ref 101–111)
CO2: 24 mmol/L (ref 22–32)
CREATININE: 0.76 mg/dL (ref 0.44–1.00)
Calcium: 8.8 mg/dL — ABNORMAL LOW (ref 8.9–10.3)
GFR calc Af Amer: >60 mL/min (ref >60)
Glucose, Bld: 89 mg/dL (ref 65–99)
Potassium: 3.9 mmol/L (ref 3.5–5.1)
Sodium: 135 mmol/L (ref 135–145)
Total Bilirubin: 0.6 mg/dL (ref 0.3–1.2)
Total Protein: 6.7 g/dL (ref 6.5–8.1)

## 2014-10-11 LAB — POC URINE PREG, ED: Preg Test, Ur: NEGATIVE

## 2014-10-11 LAB — SURGICAL PCR SCREEN
MRSA, PCR: NEGATIVE
Staphylococcus aureus: NEGATIVE

## 2014-10-11 LAB — CBC
HCT: 38 % (ref 36.0–46.0)
Hemoglobin: 12.4 g/dL (ref 12.0–15.0)
MCH: 28.7 pg (ref 26.0–34.0)
MCHC: 32.6 g/dL (ref 30.0–36.0)
MCV: 88 fL (ref 78.0–100.0)
Platelets: 348 10*3/uL (ref 150–400)
RBC: 4.32 MIL/uL (ref 3.87–5.11)
RDW: 14.1 % (ref 11.5–15.5)
WBC: 8.4 10*3/uL (ref 4.0–10.5)

## 2014-10-11 LAB — LIPASE, BLOOD: LIPASE: 454 U/L — AB (ref 22–51)

## 2014-10-11 MED ORDER — MORPHINE SULFATE 4 MG/ML IJ SOLN: 4.0000 mg | Freq: Once | INTRAMUSCULAR | Status: DC

## 2014-10-11 MED ORDER — NORGESTREL-ETHINYL ESTRADIOL 0.3-30 MG-MCG PO TABS: 1.0000 | ORAL_TABLET | Freq: Every day | ORAL | Status: DC

## 2014-10-11 MED ORDER — NICOTINE 21 MG/24HR TD PT24
21.0000 mg | MEDICATED_PATCH | Freq: Every day | TRANSDERMAL | Status: DC
  Administered 2014-10-11 – 2014-10-12 (×2): 21 mg via TRANSDERMAL
  Filled 2014-10-11 (×3): qty 1

## 2014-10-11 MED ORDER — ONDANSETRON 4 MG PO TBDP
4.0000 mg | ORAL_TABLET | Freq: Once | ORAL | Status: AC | PRN
  Administered 2014-10-11: 4 mg via ORAL
  Filled 2014-10-11: qty 1

## 2014-10-11 MED ORDER — SODIUM CHLORIDE 0.9 % IV BOLUS (SEPSIS)
1000.0000 mL | Freq: Once | INTRAVENOUS | Status: AC
  Administered 2014-10-11: 1000 mL via INTRAVENOUS

## 2014-10-11 MED ORDER — ACETAMINOPHEN 650 MG RE SUPP: 650.0000 mg | Freq: Four times a day (QID) | RECTAL | Status: DC | PRN

## 2014-10-11 MED ORDER — ENOXAPARIN SODIUM 40 MG/0.4ML ~~LOC~~ SOLN
40.0000 mg | SUBCUTANEOUS | Status: DC
  Filled 2014-10-11: qty 0.4

## 2014-10-11 MED ORDER — HYDROMORPHONE HCL 1 MG/ML IJ SOLN: 0.5000 mg | INTRAMUSCULAR | Status: DC | PRN

## 2014-10-11 MED ORDER — ACETAMINOPHEN 325 MG PO TABS: 650.0000 mg | ORAL_TABLET | Freq: Four times a day (QID) | ORAL | Status: DC | PRN

## 2014-10-11 MED ORDER — SODIUM CHLORIDE 0.9 % IV SOLN
INTRAVENOUS | Status: DC
  Administered 2014-10-11 (×2): via INTRAVENOUS
  Administered 2014-10-12: 1000 mL via INTRAVENOUS

## 2014-10-11 MED ORDER — ONDANSETRON HCL 4 MG PO TABS
4.0000 mg | ORAL_TABLET | Freq: Four times a day (QID) | ORAL | Status: DC | PRN
  Filled 2014-10-11: qty 1

## 2014-10-11 MED ORDER — CIPROFLOXACIN IN D5W 400 MG/200ML IV SOLN
400.0000 mg | INTRAVENOUS | Status: AC
  Administered 2014-10-12: 400 mg via INTRAVENOUS
  Filled 2014-10-11 (×2): qty 200

## 2014-10-11 MED ORDER — ALUM & MAG HYDROXIDE-SIMETH 200-200-20 MG/5ML PO SUSP: 30.0000 mL | Freq: Four times a day (QID) | ORAL | Status: DC | PRN

## 2014-10-11 MED ORDER — ONDANSETRON HCL 4 MG/2ML IJ SOLN
4.0000 mg | Freq: Four times a day (QID) | INTRAMUSCULAR | Status: DC | PRN
  Administered 2014-10-11 – 2014-10-12 (×2): 4 mg via INTRAVENOUS
  Filled 2014-10-11: qty 2

## 2014-10-11 NOTE — ED Notes (Signed)
Denies diagnosed history of esophageal spasms. States gave birth to child 12 weeks ago. Has no resumed normal periods since that time. Reports previous episodes of similar pain and symptoms during pregnancy.

## 2014-10-11 NOTE — ED Notes (Signed)
Kyung Rudd, MD Resident at bedside.

## 2014-10-11 NOTE — H&P (Signed)
Chief Complaint: abdominal pain HPI: Catherine Ruiz is a 24 year old female with a history appendectomy and ruptured spleen treated nonoperatively presenting with abdominal pain.  Duration of symptoms is about 10 weeks.  It started about 2 weeks after she had a baby.  Initially presented as back pain and then radiated to the upper abdomen.  Time pattern is intermittent, every other day or so.  Worse after meals.  Denies fever, chills or sweats.  Has nausea and vomiting.  Last oral intake was about 9PM last night.  Modifying factors include; ibuprofen.  No alleviating factors.  Characterized as squeezing type pain.  Her work up shows a normal WBC, increased AST/ALT, lipase 454.  Abdominal US showed cholelithiasis.  We have therefore been asked to evaluate.     Past Medical History  Diagnosis Date  . Ruptured spleen   . Miscarriage   . GERD (gastroesophageal reflux disease)   . UTI (lower urinary tract infection)     Past Surgical History  Procedure Laterality Date  . Appendectomy    . Mouth surgery    . Appendectomy  2001    Family History  Problem Relation Age of Onset  . Asthma Sister   . Asthma Brother    Social History:  reports that she has been smoking Cigarettes.  She has a 2 pack-year smoking history. She has never used smokeless tobacco. She reports that she does not drink alcohol or use illicit drugs.  Allergies:  Allergies  Allergen Reactions  . Aspirin Nausea Only  . Keflex [Cephalexin] Nausea And Vomiting  . Penicillins Nausea And Vomiting  . Sulfa Antibiotics Rash   Medication: Prior to Admission medications   Medication Sig Start Date End Date Taking? Authorizing Provider  cyclobenzaprine (FLEXERIL) 10 MG tablet Take 10 mg by mouth 3 (three) times daily as needed for muscle spasms.   Yes Historical Provider, MD  ibuprofen (ADVIL,MOTRIN) 600 MG tablet Take 600 mg by mouth every 6 (six) hours as needed (pain).   Yes Historical Provider, MD  norgestrel-ethinyl  estradiol (LO/OVRAL,CRYSELLE) 0.3-30 MG-MCG tablet Take 1 tablet by mouth daily. 08/26/14  Yes Emily Filbert, MD     (Not in a hospital admission)  Results for orders placed or performed during the hospital encounter of 10/11/14 (from the past 48 hour(s))  Comprehensive metabolic panel     Status: Abnormal   Collection Time: 10/11/14  7:02 AM  Result Value Ref Range   Sodium 135 135 - 145 mmol/L   Potassium 3.9 3.5 - 5.1 mmol/L   Chloride 106 101 - 111 mmol/L   CO2 24 22 - 32 mmol/L   Glucose, Bld 89 65 - 99 mg/dL   BUN 11 6 - 20 mg/dL   Creatinine, Ser 0.76 0.44 - 1.00 mg/dL   Calcium 8.8 (L) 8.9 - 10.3 mg/dL   Total Protein 6.7 6.5 - 8.1 g/dL   Albumin 3.5 3.5 - 5.0 g/dL   AST 139 (H) 15 - 41 U/L   ALT 68 (H) 14 - 54 U/L   Alkaline Phosphatase 103 38 - 126 U/L   Total Bilirubin 0.6 0.3 - 1.2 mg/dL   GFR calc non Af Amer >60 >60 mL/min   GFR calc Af Amer >60 >60 mL/min    Comment: (NOTE) The eGFR has been calculated using the CKD EPI equation. This calculation has not been validated in all clinical situations. eGFR's persistently <60 mL/min signify possible Chronic Kidney Disease.    Anion gap 5  5 - 15  CBC     Status: None   Collection Time: 10/11/14  7:02 AM  Result Value Ref Range   WBC 8.4 4.0 - 10.5 K/uL   RBC 4.32 3.87 - 5.11 MIL/uL   Hemoglobin 12.4 12.0 - 15.0 g/dL   HCT 38.0 36.0 - 46.0 %   MCV 88.0 78.0 - 100.0 fL   MCH 28.7 26.0 - 34.0 pg   MCHC 32.6 30.0 - 36.0 g/dL   RDW 14.1 11.5 - 15.5 %   Platelets 348 150 - 400 K/uL  Lipase, blood     Status: Abnormal   Collection Time: 10/11/14  7:02 AM  Result Value Ref Range   Lipase 454 (H) 22 - 51 U/L    Comment: RESULTS CONFIRMED BY MANUAL DILUTION  POC urine preg, ED (not at [facility])     Status: None   Collection Time: 10/11/14  7:12 AM  Result Value Ref Range   Preg Test, Ur NEGATIVE NEGATIVE    Comment:        THE SENSITIVITY OF THIS METHODOLOGY IS >24 mIU/mL    US Abdomen Complete  10/11/2014   CLINICAL  DATA:  Abdominal and back pain  EXAM: ULTRASOUND ABDOMEN COMPLETE  COMPARISON:  None.  FINDINGS: Gallbladder: Multiple small gallstones are noted without significant gallbladder wall thickening or pericholecystic fluid.  Common bile duct: Diameter: 3.7 mm.  Liver: No focal lesion identified. Within normal limits in parenchymal echogenicity.  IVC: No abnormality visualized.  Pancreas: Visualized portion unremarkable.  Spleen: Size and appearance within normal limits.  Right Kidney: Length: 11.1 cm. Echogenicity within normal limits. No mass or hydronephrosis visualized.  Left Kidney: Length: 11.8 cm. Echogenicity within normal limits. No mass or hydronephrosis visualized.  Abdominal aorta: No aneurysm visualized.  Other findings: None.  IMPRESSION: Cholelithiasis without complicating factors.   Electronically Signed   By: Inez Catalina M.D.   On: 10/11/2014 09:28    Review of Systems  All other systems reviewed and are negative.   Blood pressure 104/69, pulse 75, temperature 97.4 F (36.3 C), temperature source Oral, resp. rate 16, height 5' 4"  (1.626 m), weight 70.761 kg (156 lb), SpO2 100 %, not currently breastfeeding. Physical Exam  Constitutional: She is oriented to person, place, and time. She appears well-developed and well-nourished. No distress.  HENT:  Head: Normocephalic.  Mouth/Throat: No oropharyngeal exudate.  Eyes: Conjunctivae and EOM are normal. Pupils are equal, round, and reactive to light. No scleral icterus.  Neck: Normal range of motion. Neck supple.  Cardiovascular: Normal rate, regular rhythm, normal heart sounds and intact distal pulses.  Exam reveals no gallop and no friction rub.   No murmur heard. Respiratory: Effort normal and breath sounds normal. No respiratory distress. She has no wheezes. She has no rales. She exhibits no tenderness.  GI: Soft. Bowel sounds are normal. She exhibits no distension and no mass. There is no rebound and no guarding.  No tenderness to  LUQ.  Mild TTP RUQ.   Musculoskeletal: Normal range of motion. She exhibits no edema or tenderness.  Lymphadenopathy:    She has no cervical adenopathy.  Neurological: She is alert and oriented to person, place, and time.  Skin: Skin is warm and dry. No rash noted. She is not diaphoretic. No erythema. No pallor.  Psychiatric: She has a normal mood and affect. Her behavior is normal. Judgment and thought content normal.     Assessment/Plan Mild gallstone pancreatitis-essentially non tender to LUQ.  Will check  lipase in AM, if improved, will proceed with a laparoscopic cholecystectomy with IOC.   NPO, IVF, pain control and anti-emetics VTE prophylaxis-SCD/lovenox  Dispo-to floor  Catherine Ruiz ANP-BC 10/11/2014, 11:06 AM

## 2014-10-11 NOTE — H&P (Signed)
[facility] History and Physical  Catherine Ruiz HRC:[id number] DOB: 1990/08/29 DOA: 10/11/2014  Referring physician: Dr. Darl Householder PCP: No PCP Per Patient   Chief Complaint: Abdominal pan  HPI: Catherine Ruiz is a 24 y.o. female with an uncomplicated uterine pregnancy 12 weeks ago, appendectomy in 2001, and past ruptured spleen who presented to the ED with epigastric abdominal pain. The pain begins in her back and radiates to her epigastrium. The pain is described as a squeezing sensation and has been intermittent for the past 10 weeks, occuring 1-2 times per week and lasting 10-20 minutes with no clear aggravating or alleviating factors. Last night, the pain began after eating dinner and did not subside. Pain was 10/10 and did not allow the patient to sleep. It was worse with deep inspiration and coughing and was not affected by movement. She also had two episodes of non-bloody, non-bilious vomit after the onset of pain, accompanied with dizziness and nausea. The pain began to subside on her way to the ED this morning and is currently 1/10. The nausea has persisted. She was seen twice at [facility] for this complaint and was treated for a back spasm with flexeril. She does not have a history of gallstones, stomach ulcers, or pancreatitis. Patient is not breast feeding. She does smoke 1ppd, and denies etoh use.     While in the ED, she received Zofran that controlled her nausea. Her lipase was elevated at 451 and the abdominal US demonstrated cholelithiasis. Urine demonstrated no infection. Other labs were unremarkable. Surgery was consulted. Patient will be admitted to med-surg.   Review of Systems:  Constitutional: No weight loss, night sweats, Fevers, chills, fatigue.  HEENT:  No headaches, Difficulty swallowing, Sore throat,  No sneezing, itching, ear ache, nasal congestion, Cardio-vascular: ++dizziness No chest pain, Orthopnea, PND, swelling in lower extremities, palpitations  GI:  ++abdominal pain, nausea, vomiting, No heartburn, indigestion, diarrhea, change in bowel habits, loss of appetite  Resp: No shortness of breath with exertion or at rest. No excess mucus, no productive cough, No non-productive cough, No coughing up of blood.No change in color of mucus.No wheezing.No chest wall deformity  Skin: no rash or lesions.  GU: no dysuria, change in color of urine, no urgency or frequency. No flank pain.  Musculoskeletal: +back pain, No joint pain or swelling. No decreased range of motion. Psych: No change in mood or affect. No depression or anxiety. No memory loss.   Past Medical History  Diagnosis Date  . Ruptured spleen   . Miscarriage   . GERD (gastroesophageal reflux disease)   . UTI (lower urinary tract infection)    Past Surgical History  Procedure Laterality Date  . Appendectomy    . Mouth surgery    . Appendectomy  2001   Social History: Patient reports smoking 1ppd, denies etoh and illicit drug use. She lives at home with her husband and 76 week old newborn.  Allergies  Allergen Reactions  . Aspirin Nausea Only  . Keflex [Cephalexin] Nausea And Vomiting  . Penicillins Nausea And Vomiting  . Sulfa Antibiotics Rash    Family History  Problem Relation Age of Onset  . Asthma Sister   . Asthma Brother     Prior to Admission medications   Medication Sig Start Date End Date Taking? Authorizing Provider  cyclobenzaprine (FLEXERIL) 10 MG tablet Take 10 mg by mouth 3 (three) times daily as needed for muscle spasms.   Yes Historical Provider, MD  ibuprofen (ADVIL,MOTRIN) 600 MG tablet  Take 600 mg by mouth every 6 (six) hours as needed (pain).   Yes Historical Provider, MD  norgestrel-ethinyl estradiol (LO/OVRAL,CRYSELLE) 0.3-30 MG-MCG tablet Take 1 tablet by mouth daily. 08/26/14  Yes Emily Filbert, MD   Physical Exam: Filed Vitals:   10/11/14 0537 10/11/14 0615 10/11/14 0745 10/11/14 1030  BP: 119/65 101/66 109/60 104/69  Pulse: 72 66 64 75  Temp:  97.4 F (36.3 C)     TempSrc: Oral     Resp: 18  16 16   Height: 5' 4"  (1.626 m)     Weight: 70.761 kg (156 lb)     SpO2: 99% 99% 98% 100%    Wt Readings from Last 3 Encounters:  10/11/14 70.761 kg (156 lb)  08/26/14 70.852 kg (156 lb 3.2 oz)  08/03/14 74.844 kg (165 lb)    General:  Appears calm and comfortable, she is well-nourished, seated in bed in no distress. She is alert and oriented. Eyes: PERRL, normal lids, irises & conjunctiva ENT: grossly normal hearing, lips & tongue Neck: no LAD, masses or thyromegaly Cardiovascular: RRR, no m/r/g. No LE edema. 2+ PT pulses bilaterally. Respiratory: CTA bilaterally, no w/r/r. Normal respiratory effort. Abdomen: Striae throughout abdomen, Bowel sounds present, soft, mild epigastric TTP, +Murphys sign, not distended, no rebound tenderness, no guarding Skin: no rash or induration seen on limited exam Musculoskeletal: grossly normal tone BUE/BLE, no CVA tenderness Psychiatric: grossly normal mood and affect, speech fluent and appropriate Neurologic: grossly non-focal         Labs on Admission:  Basic Metabolic Panel:  Recent Labs Lab 10/11/14 0702  NA 135  K 3.9  CL 106  CO2 24  GLUCOSE 89  BUN 11  CREATININE 0.76  CALCIUM 8.8*   Liver Function Tests:  Recent Labs Lab 10/11/14 0702  AST 139*  ALT 68*  ALKPHOS 103  BILITOT 0.6  PROT 6.7  ALBUMIN 3.5    Recent Labs Lab 10/11/14 0702  LIPASE 454*   CBC:  Recent Labs Lab 10/11/14 0702  WBC 8.4  HGB 12.4  HCT 38.0  MCV 88.0  PLT 348   Radiological Exams on Admission: US Abdomen Complete  10/11/2014   CLINICAL DATA:  Abdominal and back pain  EXAM: ULTRASOUND ABDOMEN COMPLETE  COMPARISON:  None.  FINDINGS: Gallbladder: Multiple small gallstones are noted without significant gallbladder wall thickening or pericholecystic fluid.  Common bile duct: Diameter: 3.7 mm.  Liver: No focal lesion identified. Within normal limits in parenchymal echogenicity.  IVC: No  abnormality visualized.  Pancreas: Visualized portion unremarkable.  Spleen: Size and appearance within normal limits.  Right Kidney: Length: 11.1 cm. Echogenicity within normal limits. No mass or hydronephrosis visualized.  Left Kidney: Length: 11.8 cm. Echogenicity within normal limits. No mass or hydronephrosis visualized.  Abdominal aorta: No aneurysm visualized.  Other findings: None.  IMPRESSION: Cholelithiasis without complicating factors.   Electronically Signed   By: Inez Catalina M.D.   On: 10/11/2014 09:28    EKG: No EKG available  Assessment/Plan Principal Problem:   Acute gallstone pancreatitis Active Problems:   Tobacco abuse   Cholelithiases   1. Acute gallstone pancreatitis - Abdominal US demonstrated cholelithiasis and patient has an elevated lipase at 451 with accompanying abdominal pain, nausea and vomiting - AST/ALT silghtly elevated, Alk phos WNL - Clear liquid diet since patient is in minimal pain - IVF, Tylenol or dilaudid PRN for pain, Maalox PRN for dyspepsia  - Consult surgery - Monitor CMP  2. Cholelithiasis - Abdominal US  demonstrated cholelithiasis, likely cause of pancreatitis - May be contributed to recent pregnancy, no other risk factors for cholelithiasis - Consult surgery  3. Tobacco Abuse - Currently smokes 1ppd - Nicotine patch and cessation counseling   Code Status: Full DVT Prophylaxis: SCDs and enoxaparin Family Communication: Patient and husband at bedside Disposition Plan: Admit to inpatient, likely discharge in 1-2 days  Time spent: [address] PA-S Imogene Burn, [place] [facility] Pager [phone]  I have taken an interval history, reviewed the chart and examined the patient. I agree with the Advanced Practice Provider's note, impression and recommendations. I have made any necessary editorial changes. 24 year old female came with abdominal pain, found to have gallstone pancreatitis. Surgery consulted, and surgery has  taken over as the primary attending. Possible surgery tomorrow or Wednesday.

## 2014-10-11 NOTE — ED Notes (Signed)
Returned from ultrasound.

## 2014-10-11 NOTE — ED Notes (Signed)
Patient here with complaint of mid back pain followed by encircling pain which radiates towards epigastrium. States that pain immediately precipitates vomiting. Symptoms have subsided at this point but states that earlier she was crying because of the pain.

## 2014-10-11 NOTE — ED Notes (Signed)
Admitting at bedside 

## 2014-10-11 NOTE — ED Notes (Signed)
[place]aged [facility] [place] [phone] 

## 2014-10-11 NOTE — ED Provider Notes (Signed)
CSN: [id number]     Arrival date & time 10/11/14  0533 History   First MD Initiated Contact with Patient 10/11/14 [phone]     Chief Complaint  Patient presents with  . Emesis  . Abdominal Pain     (Consider location/radiation/quality/duration/timing/severity/associated sxs/prior Treatment) Patient is a 24 y.o. female presenting with abdominal pain. The history is provided by the patient. No language interpreter was used.  Abdominal Pain Pain location:  Epigastric Pain quality: cramping and squeezing   Pain radiation: Originates in the midthoracic paravertebral region and radiates around to epigastrum. Pain severity:  Severe Onset quality:  Sudden Duration:  10 weeks Timing:  Intermittent Progression:  Unchanged Chronicity:  Recurrent Context: not alcohol use, not eating, not recent illness, not recent travel, not sick contacts and not suspicious food intake   Relieved by:  Nothing Worsened by:  Position changes Ineffective treatments:  NSAIDs Associated symptoms: nausea and vomiting   Associated symptoms: no chest pain, no chills, no constipation, no cough, no diarrhea, no dysuria, no fever, no hematemesis, no hematochezia, no hematuria, no shortness of breath and no sore throat   Risk factors: NSAID use and pregnancy       Past Medical History  Diagnosis Date  . Ruptured spleen   . Miscarriage   . GERD (gastroesophageal reflux disease)   . UTI (lower urinary tract infection)    Past Surgical History  Procedure Laterality Date  . Appendectomy    . Mouth surgery    . Appendectomy  2001   Family History  Problem Relation Age of Onset  . Asthma Sister   . Asthma Brother    History  Substance Use Topics  . Smoking status: Current Every Day Smoker -- 1.00 packs/day for 2 years    Types: Cigarettes    Last Attempt to Quit: 10/17/2013  . Smokeless tobacco: Never Used     Comment: cutting back/trying to quit  . Alcohol Use: No   OB History    Gravida Para Term Preterm  AB TAB SAB Ectopic Multiple Living   2 1 1  1  1   0 1     Review of Systems  Constitutional: Negative for fever, chills and diaphoresis.  HENT: Negative for ear pain, sinus pressure and sore throat.   Eyes: Negative for visual disturbance.  Respiratory: Negative for cough, shortness of breath and wheezing.   Cardiovascular: Negative for chest pain, palpitations and leg swelling.  Gastrointestinal: Positive for nausea, vomiting and abdominal pain. Negative for diarrhea, constipation, blood in stool, hematochezia, abdominal distention and hematemesis.  Endocrine: Negative for polyuria.  Genitourinary: Negative for dysuria, urgency, frequency, hematuria, flank pain, decreased urine volume and difficulty urinating.  Musculoskeletal: Positive for myalgias and back pain. Negative for gait problem.  Skin: Negative for rash.  Neurological: Negative for dizziness, syncope, weakness, numbness and headaches.  Hematological: Does not bruise/bleed easily.  Psychiatric/Behavioral: Negative for confusion and agitation.  All other systems reviewed and are negative.     Allergies  Aspirin; Keflex; Penicillins; and Sulfa antibiotics  Home Medications   Prior to Admission medications   Medication Sig Start Date End Date Taking? Authorizing Provider  cyclobenzaprine (FLEXERIL) 10 MG tablet Take 10 mg by mouth 3 (three) times daily as needed for muscle spasms.   Yes Historical Provider, MD  ibuprofen (ADVIL,MOTRIN) 600 MG tablet Take 600 mg by mouth every 6 (six) hours as needed (pain).   Yes Historical Provider, MD  norgestrel-ethinyl estradiol (LO/OVRAL,CRYSELLE) 0.3-30 MG-MCG tablet Take 1  tablet by mouth daily. 08/26/14  Yes Myra Inda Coke, MD   BP 115/61 mmHg  Pulse 60  Temp(Src) 97.4 F (36.3 C) (Oral)  Resp 16  Ht  (1.626 m)  Wt 156 lb (70.761 kg)  BMI 26.76 kg/m2  SpO2 99%  Breastfeeding? No Physical Exam  Constitutional: She is oriented to person, place, and time. She appears  well-nourished. No distress.  HENT:  Head: Normocephalic and atraumatic.  Right Ear: External ear normal.  Left Ear: External ear normal.  Eyes: Conjunctivae are normal. Pupils are equal, round, and reactive to light.  Neck: Normal range of motion. Neck supple.  Cardiovascular: Normal rate, regular rhythm, normal heart sounds and intact distal pulses.  Exam reveals no gallop and no friction rub.   No murmur heard. Pulmonary/Chest: Effort normal and breath sounds normal. She has no wheezes. She has no rales.  Abdominal: Soft. Bowel sounds are normal. She exhibits no distension. There is no tenderness.  Musculoskeletal: Normal range of motion. She exhibits no edema or tenderness.  Neurological: She is alert and oriented to person, place, and time. She has normal reflexes. No cranial nerve deficit. Coordination normal.  Skin: Skin is warm. No rash noted. She is not diaphoretic. No erythema.  Psychiatric: She has a normal mood and affect. Her behavior is normal.  Nursing note and vitals reviewed.   ED Course  Procedures (including critical care time) Labs Review Labs Reviewed  COMPREHENSIVE METABOLIC PANEL - Abnormal; Notable for the following:    Calcium 8.8 (*)    AST 139 (*)    ALT 68 (*)    All other components within normal limits  LIPASE, BLOOD - Abnormal; Notable for the following:    Lipase 454 (*)    All other components within normal limits  CBC  URINALYSIS, ROUTINE W REFLEX MICROSCOPIC (NOT AT [facility])  HEPATITIS PANEL, ACUTE  POC URINE PREG, ED    Imaging Review US Abdomen Complete  10/11/2014   CLINICAL DATA:  Abdominal and back pain  EXAM: ULTRASOUND ABDOMEN COMPLETE  COMPARISON:  None.  FINDINGS: Gallbladder: Multiple small gallstones are noted without significant gallbladder wall thickening or pericholecystic fluid.  Common bile duct: Diameter: 3.7 mm.  Liver: No focal lesion identified. Within normal limits in parenchymal echogenicity.  IVC: No abnormality visualized.   Pancreas: Visualized portion unremarkable.  Spleen: Size and appearance within normal limits.  Right Kidney: Length: 11.1 cm. Echogenicity within normal limits. No mass or hydronephrosis visualized.  Left Kidney: Length: 11.8 cm. Echogenicity within normal limits. No mass or hydronephrosis visualized.  Abdominal aorta: No aneurysm visualized.  Other findings: None.  IMPRESSION: Cholelithiasis without complicating factors.   Electronically Signed   By: Alcide Clever M.D.   On: 10/11/2014 09:28     EKG Interpretation None      MDM   Final diagnoses:  Abdominal pain  Calculus of gallbladder and bile duct without cholecystitis or obstruction  Other pancreatitis    Mrs. Goranson is a 24yo F with PMH of ruptured spleen, uncomplicated uterine pregnancy 12 weeks ago, appendectomy in 2001 who presents with a 10 week history of squeezing midthoracic back pain that radiates to her epigastrum and is immediately followed by nausea and non-bloody vomiting. It is not associated with any meals or activities, and she has normal bowel function. She is afebrile and hemodynamically stable here in the ED, without pain, last episode was last night. Patient is nontender on exam. Labs show slightly elevated LFTs, AST>ALT, lipase near  500, RUQ [place] at bedside shows cholelithiasis, pancreas appears normal on US exam. Consulted surgery for probably gallstone pancreatitis, will admit to medicine with surgery to follow, per their recs.    Darrick Huntsman, MD 10/11/14 1154  Darrick Huntsman, MD 10/11/14 1158  Richardean Canal, MD 10/11/14 [phone]

## 2014-10-11 NOTE — ED Notes (Signed)
Attempted report 

## 2014-10-12 ENCOUNTER — Encounter (HOSPITAL_COMMUNITY): Payer: Self-pay | Admitting: Certified Registered"

## 2014-10-12 ENCOUNTER — Inpatient Hospital Stay (HOSPITAL_COMMUNITY): Payer: 59 | Admitting: Anesthesiology

## 2014-10-12 ENCOUNTER — Inpatient Hospital Stay (HOSPITAL_COMMUNITY): Payer: 59

## 2014-10-12 ENCOUNTER — Encounter (HOSPITAL_COMMUNITY): Admission: EM | Disposition: A | Discharge status: Home / Self Care | Payer: Self-pay | Source: Home / Self Care

## 2014-10-12 HISTORY — PX: CHOLECYSTECTOMY: SHX55

## 2014-10-12 LAB — HEPATITIS PANEL, ACUTE
HCV Ab: <0.1 s/co ratio (ref 0.0–0.9)
Hep A IgM: NEGATIVE
Hep B C IgM: NEGATIVE
Hepatitis B Surface Ag: NEGATIVE

## 2014-10-12 LAB — COMPREHENSIVE METABOLIC PANEL
ALT: 66 U/L — AB (ref 14–54)
AST: 52 U/L — ABNORMAL HIGH (ref 15–41)
Albumin: 3 g/dL — ABNORMAL LOW (ref 3.5–5.0)
Alkaline Phosphatase: 89 U/L (ref 38–126)
Anion gap: 6 (ref 5–15)
BUN: 7 mg/dL (ref 6–20)
CALCIUM: 8.4 mg/dL — AB (ref 8.9–10.3)
CHLORIDE: 110 mmol/L (ref 101–111)
CO2: 23 mmol/L (ref 22–32)
Creatinine, Ser: 0.81 mg/dL (ref 0.44–1.00)
GFR calc Af Amer: >60 mL/min (ref >60)
GFR calc non Af Amer: >60 mL/min (ref >60)
Glucose, Bld: 84 mg/dL (ref 65–99)
Potassium: 4 mmol/L (ref 3.5–5.1)
Sodium: 139 mmol/L (ref 135–145)
TOTAL PROTEIN: 5.9 g/dL — AB (ref 6.5–8.1)
Total Bilirubin: 0.6 mg/dL (ref 0.3–1.2)

## 2014-10-12 LAB — LIPASE, BLOOD: Lipase: 21 U/L — ABNORMAL LOW (ref 22–51)

## 2014-10-12 SURGERY — LAPAROSCOPIC CHOLECYSTECTOMY WITH INTRAOPERATIVE CHOLANGIOGRAM
Anesthesia: General

## 2014-10-12 MED ORDER — HYDROMORPHONE HCL 1 MG/ML IJ SOLN
INTRAMUSCULAR | Status: AC
  Filled 2014-10-12: qty 1

## 2014-10-12 MED ORDER — ONDANSETRON HCL 4 MG/2ML IJ SOLN
4.0000 mg | Freq: Once | INTRAMUSCULAR | Status: AC | PRN
  Administered 2014-10-12: 4 mg via INTRAVENOUS

## 2014-10-12 MED ORDER — BUPIVACAINE-EPINEPHRINE (PF) 0.25% -1:200000 IJ SOLN
INTRAMUSCULAR | Status: AC
  Filled 2014-10-12: qty 30

## 2014-10-12 MED ORDER — GLYCOPYRROLATE 0.2 MG/ML IJ SOLN
INTRAMUSCULAR | Status: DC | PRN
  Administered 2014-10-12: .8 mg via INTRAVENOUS

## 2014-10-12 MED ORDER — NEOSTIGMINE METHYLSULFATE 10 MG/10ML IV SOLN
INTRAVENOUS | Status: DC | PRN
  Administered 2014-10-12: 5 mg via INTRAVENOUS

## 2014-10-12 MED ORDER — SODIUM CHLORIDE 0.9 % IR SOLN
Status: DC | PRN
  Administered 2014-10-12: 1000 mL

## 2014-10-12 MED ORDER — BUPIVACAINE-EPINEPHRINE 0.25% -1:200000 IJ SOLN
INTRAMUSCULAR | Status: DC | PRN
  Administered 2014-10-12: 17 mL

## 2014-10-12 MED ORDER — DEXAMETHASONE SODIUM PHOSPHATE 4 MG/ML IJ SOLN
INTRAMUSCULAR | Status: DC | PRN
  Administered 2014-10-12: 4 mg via INTRAVENOUS

## 2014-10-12 MED ORDER — SODIUM CHLORIDE 0.9 % IV SOLN
INTRAVENOUS | Status: DC | PRN
  Administered 2014-10-12: 5 mL

## 2014-10-12 MED ORDER — FENTANYL CITRATE (PF) 100 MCG/2ML IJ SOLN
INTRAMUSCULAR | Status: DC | PRN
  Administered 2014-10-12: 100 ug via INTRAVENOUS
  Administered 2014-10-12 (×2): 50 ug via INTRAVENOUS

## 2014-10-12 MED ORDER — HYDROMORPHONE HCL 1 MG/ML IJ SOLN
0.5000 mg | INTRAMUSCULAR | Status: DC | PRN
  Administered 2014-10-12 (×3): 0.5 mg via INTRAVENOUS

## 2014-10-12 MED ORDER — DEXAMETHASONE SODIUM PHOSPHATE 4 MG/ML IJ SOLN
INTRAMUSCULAR | Status: AC
  Filled 2014-10-12: qty 1

## 2014-10-12 MED ORDER — LIDOCAINE HCL (CARDIAC) 20 MG/ML IV SOLN
INTRAVENOUS | Status: AC
  Filled 2014-10-12: qty 5

## 2014-10-12 MED ORDER — PROPOFOL 10 MG/ML IV BOLUS
INTRAVENOUS | Status: AC
  Filled 2014-10-12: qty 20

## 2014-10-12 MED ORDER — OXYCODONE-ACETAMINOPHEN 5-325 MG PO TABS: 1.0000 | ORAL_TABLET | ORAL | Status: DC | PRN

## 2014-10-12 MED ORDER — PROPOFOL 10 MG/ML IV BOLUS
INTRAVENOUS | Status: DC | PRN
  Administered 2014-10-12: 150 mg via INTRAVENOUS

## 2014-10-12 MED ORDER — ROCURONIUM BROMIDE 100 MG/10ML IV SOLN
INTRAVENOUS | Status: DC | PRN
  Administered 2014-10-12: 50 mg via INTRAVENOUS

## 2014-10-12 MED ORDER — ONDANSETRON HCL 4 MG/2ML IJ SOLN
INTRAMUSCULAR | Status: AC
  Filled 2014-10-12: qty 2

## 2014-10-12 MED ORDER — MIDAZOLAM HCL 2 MG/2ML IJ SOLN
INTRAMUSCULAR | Status: AC
  Filled 2014-10-12: qty 2

## 2014-10-12 MED ORDER — ROCURONIUM BROMIDE 50 MG/5ML IV SOLN
INTRAVENOUS | Status: AC
  Filled 2014-10-12: qty 1

## 2014-10-12 MED ORDER — LIDOCAINE HCL (CARDIAC) 20 MG/ML IV SOLN
INTRAVENOUS | Status: DC | PRN
  Administered 2014-10-12: 100 mg via INTRAVENOUS

## 2014-10-12 MED ORDER — MIDAZOLAM HCL 5 MG/5ML IJ SOLN
INTRAMUSCULAR | Status: DC | PRN
  Administered 2014-10-12: 2 mg via INTRAVENOUS

## 2014-10-12 MED ORDER — ARTIFICIAL TEARS OP OINT
TOPICAL_OINTMENT | OPHTHALMIC | Status: AC
  Filled 2014-10-12: qty 3.5

## 2014-10-12 MED ORDER — OXYCODONE-ACETAMINOPHEN 5-325 MG PO TABS
1.0000 | ORAL_TABLET | Freq: Once | ORAL | Status: AC
Start: 2014-10-12 — End: 2014-10-12
  Administered 2014-10-12: 1 via ORAL
  Filled 2014-10-12: qty 2

## 2014-10-12 MED ORDER — 0.9 % SODIUM CHLORIDE (POUR BTL) OPTIME
TOPICAL | Status: DC | PRN
  Administered 2014-10-12: 1000 mL

## 2014-10-12 MED ORDER — FENTANYL CITRATE (PF) 250 MCG/5ML IJ SOLN
INTRAMUSCULAR | Status: AC
  Filled 2014-10-12: qty 5

## 2014-10-12 MED ORDER — LACTATED RINGERS IV SOLN
INTRAVENOUS | Status: DC
  Administered 2014-10-12: 11:00:00 via INTRAVENOUS

## 2014-10-12 SURGICAL SUPPLY — 40 items
APPLIER CLIP 5 13 M/L LIGAMAX5 (MISCELLANEOUS) ×2
BLADE SURG ROTATE 9660 (MISCELLANEOUS) IMPLANT
CANISTER SUCTION 2500CC (MISCELLANEOUS) ×2 IMPLANT
CHLORAPREP W/TINT 26ML (MISCELLANEOUS) ×2 IMPLANT
CLIP APPLIE 5 13 M/L LIGAMAX5 (MISCELLANEOUS) ×1 IMPLANT
CLOSURE STERI-STRIP 1/4X4 (GAUZE/BANDAGES/DRESSINGS) ×2 IMPLANT
COVER MAYO STAND STRL (DRAPES) ×2 IMPLANT
COVER SURGICAL LIGHT HANDLE (MISCELLANEOUS) ×2 IMPLANT
DERMABOND ADVANCED (GAUZE/BANDAGES/DRESSINGS) ×1
DERMABOND ADVANCED .7 DNX12 (GAUZE/BANDAGES/DRESSINGS) ×1 IMPLANT
DRAPE C-ARM 42X72 X-RAY (DRAPES) ×2 IMPLANT
DRSG TEGADERM 2-3/8X2-3/4 SM (GAUZE/BANDAGES/DRESSINGS) ×8 IMPLANT
DRSG TEGADERM 4X4.75 (GAUZE/BANDAGES/DRESSINGS) ×2 IMPLANT
ELECT REM PT RETURN 9FT ADLT (ELECTROSURGICAL) ×2
ELECTRODE REM PT RTRN 9FT ADLT (ELECTROSURGICAL) ×1 IMPLANT
GLOVE BIO SURGEON STRL SZ 6.5 (GLOVE) ×2 IMPLANT
GLOVE BIOGEL PI IND STRL 8 (GLOVE) ×1 IMPLANT
GLOVE BIOGEL PI INDICATOR 8 (GLOVE) ×1
GLOVE ECLIPSE 7.5 STRL STRAW (GLOVE) ×2 IMPLANT
GLOVE INDICATOR 6.5 STRL GRN (GLOVE) ×2 IMPLANT
GOWN STRL REUS W/ TWL LRG LVL3 (GOWN DISPOSABLE) ×3 IMPLANT
GOWN STRL REUS W/TWL LRG LVL3 (GOWN DISPOSABLE) ×3
KIT BASIN OR (CUSTOM PROCEDURE TRAY) ×2 IMPLANT
KIT ROOM TURNOVER OR (KITS) ×2 IMPLANT
NS IRRIG 1000ML POUR BTL (IV SOLUTION) ×2 IMPLANT
PAD ARMBOARD 7.5X6 YLW CONV (MISCELLANEOUS) ×2 IMPLANT
POUCH SPECIMEN RETRIEVAL 10MM (ENDOMECHANICALS) IMPLANT
SCISSORS LAP 5X35 DISP (ENDOMECHANICALS) ×2 IMPLANT
SET CHOLANGIOGRAPH 5 50 .035 (SET/KITS/TRAYS/PACK) ×2 IMPLANT
SET IRRIG TUBING LAPAROSCOPIC (IRRIGATION / IRRIGATOR) ×2 IMPLANT
SLEEVE ENDOPATH XCEL 5M (ENDOMECHANICALS) ×4 IMPLANT
SPECIMEN JAR SMALL (MISCELLANEOUS) ×2 IMPLANT
STRIP CLOSURE SKIN 1/2X4 (GAUZE/BANDAGES/DRESSINGS) ×2 IMPLANT
SUT MNCRL AB 4-0 PS2 18 (SUTURE) ×2 IMPLANT
TOWEL OR 17X24 6PK STRL BLUE (TOWEL DISPOSABLE) ×2 IMPLANT
TOWEL OR 17X26 10 PK STRL BLUE (TOWEL DISPOSABLE) ×2 IMPLANT
TRAY LAPAROSCOPIC MC (CUSTOM PROCEDURE TRAY) ×2 IMPLANT
TROCAR XCEL BLUNT TIP 100MML (ENDOMECHANICALS) ×2 IMPLANT
TROCAR XCEL NON-BLD 5MMX100MML (ENDOMECHANICALS) ×2 IMPLANT
TUBING INSUFFLATION (TUBING) ×2 IMPLANT

## 2014-10-12 NOTE — Transfer of Care (Signed)
Immediate Anesthesia Transfer of Care Note  Patient: Catherine Ruiz  Procedure(s) Performed: Procedure(s): LAPAROSCOPIC CHOLECYSTECTOMY WITH INTRAOPERATIVE CHOLANGIOGRAM (N/A)  Patient Location: PACU  Anesthesia Type:General  Level of Consciousness: awake, alert , oriented and patient cooperative  Airway & Oxygen Therapy: Patient Spontanous Breathing  Post-op Assessment: Report given to RN and Post -op Vital signs reviewed and stable  Post vital signs: Reviewed and stable  Last Vitals:  Filed Vitals:   10/12/14 0523  BP: 109/6  Pulse: 69  Temp: 36.7 C  Resp: 18    Complications: No apparent anesthesia complications

## 2014-10-12 NOTE — Progress Notes (Signed)
Patient returned from PACU. Denies pain in her abdomen, but states her back is very sore. Abdomen with five incisions - all incisions approximated with dressings in place. Ambulated to the bathroom with assistance and voided in toilet, unfortunately urine collection container was not in the toilet so her output was unable to be measured. Full liquid dinner tray ordered. Patient aware of the criteria that needs to be met in order for her to be discharged this evening.   Catherine Ruiz, BSN, RN-BC. 

## 2014-10-12 NOTE — Anesthesia Procedure Notes (Signed)
Procedure Name: Intubation Date/Time: 10/12/2014 2:11 PM Performed by: Jefm Miles E Pre-anesthesia Checklist: Patient identified, Emergency Drugs available, Suction available, Patient being monitored and Timeout performed Patient Re-evaluated:Patient Re-evaluated prior to inductionOxygen Delivery Method: Circle system utilized Preoxygenation: Pre-oxygenation with 100% oxygen Intubation Type: IV induction Ventilation: Mask ventilation without difficulty Laryngoscope Size: Mac and 3 Grade View: Grade I Tube type: Oral Tube size: 7.0 mm Number of attempts: 1 Airway Equipment and Method: Stylet Placement Confirmation: ETT inserted through vocal cords under direct vision,  positive ETCO2 and breath sounds checked- equal and bilateral Secured at: 20 cm Tube secured with: Tape Dental Injury: Teeth and Oropharynx as per pre-operative assessment

## 2014-10-12 NOTE — Progress Notes (Signed)
Utilization Review Completed.Catherine Ruiz T7/26/2016  

## 2014-10-12 NOTE — Op Note (Signed)
OPERATIVE REPORT  DATE OF OPERATION:  10/12/2014  PATIENT:  Catherine Ruiz  24 y.o. female  PRE-OPERATIVE DIAGNOSIS:  Gallstone pancreatitis  POST-OPERATIVE DIAGNOSIS:  Gallstone pancreatitis  PROCEDURE:  Procedure(s): LAPAROSCOPIC CHOLECYSTECTOMY WITH INTRAOPERATIVE CHOLANGIOGRAM  SURGEON:  Surgeon(s): Jimmye Norman, MD  ASSISTANT: Hornbeck, PA-S  ANESTHESIA:   general  EBL: <20 ml  BLOOD ADMINISTERED: none  DRAINS: none   SPECIMEN:  Source of Specimen:  Gallbladder and contents  COUNTS CORRECT:  YES  PROCEDURE DETAILS: The patient was taken to the operating room and placed on the table in the supine position.  After an adequate endotracheal anesthetic was administered, the patient was prepped with ChloroPrep, and then draped in the usual manner exposing the entire abdomen laterally, inferiorly and up  to the costal margins.  After a proper timeout was performed including identifying the patient and the procedure to be performed, a supraumbilical 1.5cm midline incision was made using a #15 blade.  This was taken down to the fascia which was then incised with a #15 blade.  The edges of the fascia were tented up with Kocher clamps as the preperitoneal space was penetrated with a Kelly clamp into the peritoneum.  Once this was done, a pursestring suture of 0 Vicryl was passed around the fascial opening.  This was subsequently used to secure the [facility] cannula which was passed into the peritoneal cavity.  Once the [facility] cannula was in place, carbon dioxide gas was insufflated into the peritoneal cavity up to a maximal intra-abdominal pressure of 15mm Hg.The laparoscope, with attached camera and light source, was passed into the peritoneal cavity to visualize the direct insertion of two right upper quadrant 5mm cannulas, and a sup-xiphoid 5mm cannula.  Once all cannulas were in place, the dissection was begun.  Two ratcheted graspers were attached to the dome and infundibulum of the  gallbladder and retracted towards the anterior abdominal wall and the right upper quadrant.  Using cautery attached to a dissecting forceps, the peritoneum overlaying the triangle of Chalot and the hepatoduodenal triangle was dissected away exposing the cystic duct and the cystic artery.  A critical window was developed between the CBD and the cystic duct The cystic artery was clipped proximally and distally then transected.  A clip was placed on the gallbladder side of the cystic duct, then a cholecystodochotomy made using the laparoscopic scissors.  Through the cholecystodochotomy a Cook catheter was passed to performed a cholangiogram.  The cholangiogram showed good proximal filling, no intraductal filling defects, no dilatation, and good flow into the duodenum.  Once the cholangiogram was completed, the [facility] catheter was removed, and the distal cystic duct was clipped multiple times then transected between the clips.  The gallbladder was then dissected out of the hepatic bed without event.  It was retrieved from the abdomen (using an EndoCatch bag) without event.  Once the gallbladder was removed, the bed was inspected for hemostasis.  Once excellent hemostasis was obtained all gas and fluids were aspirated from above the liver, then the cannulas were removed.  The supraumbilical incision was closed using the pursestring suture which was in place.  0.25% bupivicaine with epinephrine was injected at all sites.  All 10mm or greater cannula sites were close using a running subcuticular stitch of 4-0 Monocryl.  5.4mm cannula sites were closed with Dermabond only.Steri-Strips and Tagaderm were used to complete the dressings at all sites.  At this point all needle, sponge, and instrument counts were correct.The patient was awakened from  anesthesia and taken to the PACU in stable condition.  PATIENT DISPOSITION:  PACU - hemodynamically stable.   Navy Rothschild 7/26/20163:15 PM

## 2014-10-12 NOTE — Discharge Instructions (Signed)
[facility] ______[facility] °LAPAROSCOPIC SURGERY: POST OP INSTRUCTIONS °Always review your discharge instruction sheet given to you by the facility where your surgery was performed. °IF YOU HAVE DISABILITY OR FAMILY LEAVE FORMS, YOU MUST BRING THEM TO THE OFFICE FOR PROCESSING.   °DO NOT GIVE THEM TO YOUR DOCTOR. ° °1. A prescription for pain medication may be given to you upon discharge.  Take your pain medication as prescribed, if needed.  If narcotic pain medicine is not needed, then you may take acetaminophen (Tylenol) or ibuprofen (Advil) as needed. °2. Take your usually prescribed medications unless otherwise directed. °3. If you need a refill on your pain medication, please contact your pharmacy.  They will contact our office to request authorization. Prescriptions will not be filled after 5pm or on week-ends. °4. You should follow a light diet the first few days after arrival home, such as soup and crackers, etc.  Be sure to include lots of fluids daily. °5. Most patients will experience some swelling and bruising in the area of the incisions.  Ice packs will help.  Swelling and bruising can take several days to resolve.  °6. It is common to experience some constipation if taking pain medication after surgery.  Increasing fluid intake and taking a stool softener (such as Colace) will usually help or prevent this problem from occurring.  A mild laxative (Milk of Magnesia or Miralax) should be taken according to package instructions if there are no bowel movements after 48 hours. °7. Unless discharge instructions indicate otherwise, you may remove your bandages 24-48 hours after surgery, and you may shower at that time.  You may have steri-strips (small skin tapes) in place directly over the incision.  These strips should be left on the skin for 7-10 days.  If your surgeon used skin glue on the incision, you may shower in 24 hours.  The glue will flake off over the next 2-3 weeks.  Any sutures or  staples will be removed at the office during your follow-up visit. °8. ACTIVITIES:  You may resume regular (light) daily activities beginning the next day--such as daily self-care, walking, climbing stairs--gradually increasing activities as tolerated.  You may have sexual intercourse when it is comfortable.  Refrain from any heavy lifting or straining until approved by your doctor. °a. You may drive when you are no longer taking prescription pain medication, you can comfortably wear a seatbelt, and you can safely maneuver your car and apply brakes. °b. RETURN TO WORK:  __________________________________________________________ °9. You should see your doctor in the office for a follow-up appointment approximately 2-3 weeks after your surgery.  Make sure that you call for this appointment within a day or two after you arrive home to insure a convenient appointment time. °10. OTHER INSTRUCTIONS: __________________________________________________________________________________________________________________________ __________________________________________________________________________________________________________________________ °WHEN TO CALL YOUR DOCTOR: °1. Fever over 101.0 °2. Inability to urinate °3. Continued bleeding from incision. °4. Increased pain, redness, or drainage from the incision. °5. Increasing abdominal pain ° °The clinic staff is available to answer your questions during regular business hours.  Please don’t hesitate to call and ask to speak to one of the nurses for clinical concerns.  If you have a medical emergency, go to the nearest emergency room or call 911.  A surgeon from [facility] is always on call at the hospital. °[address], [address], [place], [place]  [address] ? [address] [address], [place], [place]   [address] °[phone] ? [phone] ? FAX [phone] °Web site:   [email or web address] °

## 2014-10-12 NOTE — Anesthesia Preprocedure Evaluation (Addendum)
Anesthesia Evaluation  Patient identified by MRN, date of birth, ID band Patient awake    Reviewed: Allergy & Precautions, NPO status , Patient's Chart, lab work & pertinent test results  History of Anesthesia Complications (+) PONV  Airway Mallampati: II  TM Distance: >3 FB     Dental  (+) Teeth Intact, Dental Advisory Given   Pulmonary Current Smoker,    Pulmonary exam normal       Cardiovascular Normal cardiovascular examRhythm:Regular     Neuro/Psych    GI/Hepatic GERD-  ,  Endo/Other    Renal/GU      Musculoskeletal   Abdominal   Peds  Hematology   Anesthesia Other Findings   Reproductive/Obstetrics                            Anesthesia Physical Anesthesia Plan  ASA: I  Anesthesia Plan: General   Post-op Pain Management:    Induction: Intravenous  Airway Management Planned: Oral ETT  Additional Equipment:   Intra-op Plan:   Post-operative Plan: Extubation in OR  Informed Consent: I have reviewed the patients History and Physical, chart, labs and discussed the procedure including the risks, benefits and alternatives for the proposed anesthesia with the patient or authorized representative who has indicated his/her understanding and acceptance.     Plan Discussed with: CRNA, Anesthesiologist and Surgeon  Anesthesia Plan Comments:         Anesthesia Quick Evaluation

## 2014-10-12 NOTE — Progress Notes (Signed)
Patient ID: Catherine Ruiz, female   DOB: 06/06/1990, 24 y.o.   MRN: [id number]     [facility]      [address] [place], [place] [address]    Phone: [phone] FAX: [phone]     Subjective: Lipase normalizing.  LFTs trending down.  No pain. No n/v.  VSS.  Afebrile.   Objective:  Vital signs:  Filed Vitals:   10/11/14 1518 10/11/14 2109 10/11/14 2145 10/12/14 0523  BP: 111/59 119/67  109/6  Pulse: 51 78  69  Temp: 98.3 F (36.8 C) 98.2 F (36.8 C)  98.1 F (36.7 C)  TempSrc: Oral Oral  Oral  Resp: _0 Height:      Weight:   71.215 kg (157 lb)   SpO2: 100% 98%  98%       Intake/Output   Yesterday:  07/25 0701 - 07/26 0700 In: 0  Out: 800 [Urine:800] This shift:    I/O last 3 completed shifts: In: 0  Out: 800 [Urine:800]    Physical Exam: General: Pt awake/alert/oriented x4 in no acute distress Chest: cta.  No chest wall pain w good excursion CV:  Pulses intact.  Regular rhythm MS: Normal AROM mjr joints.  No obvious deformity Abdomen: Soft.  Nondistended.  Non tender.  No evidence of peritonitis.  No incarcerated hernias. Ext:  SCDs BLE.  No mjr edema.  No cyanosis Skin: No petechiae / purpura   Problem List:   Principal Problem:   Acute gallstone pancreatitis Active Problems:   Tobacco abuse   Cholelithiases    Results:   Labs: Results for orders placed or performed during the hospital encounter of 10/11/14 (from the past 48 hour(s))  Comprehensive metabolic panel     Status: Abnormal   Collection Time: 10/11/14  7:02 AM  Result Value Ref Range   Sodium 135 135 - 145 mmol/L   Potassium 3.9 3.5 - 5.1 mmol/L   Chloride 106 101 - 111 mmol/L   CO2 24 22 - 32 mmol/L   Glucose, Bld 89 65 - 99 mg/dL   BUN 11 6 - 20 mg/dL   Creatinine, Ser 0.76 0.44 - 1.00 mg/dL   Calcium 8.8 (L) 8.9 - 10.3 mg/dL   Total Protein 6.7 6.5 - 8.1 g/dL   Albumin 3.5 3.5 - 5.0 g/dL   AST 139 (H) 15 - 41 U/L    ALT 68 (H) 14 - 54 U/L   Alkaline Phosphatase 103 38 - 126 U/L   Total Bilirubin 0.6 0.3 - 1.2 mg/dL   GFR calc non Af Amer >60 >60 mL/min   GFR calc Af Amer >60 >60 mL/min    Comment: (NOTE) The eGFR has been calculated using the CKD EPI equation. This calculation has not been validated in all clinical situations. eGFR's persistently <60 mL/min signify possible Chronic Kidney Disease.    Anion gap 5 5 - 15  CBC     Status: None   Collection Time: 10/11/14  7:02 AM  Result Value Ref Range   WBC 8.4 4.0 - 10.5 K/uL   RBC 4.32 3.87 - 5.11 MIL/uL   Hemoglobin 12.4 12.0 - 15.0 g/dL   HCT 38.0 36.0 - 46.0 %   MCV 88.0 78.0 - 100.0 fL   MCH 28.7 26.0 - 34.0 pg   MCHC 32.6 30.0 - 36.0 g/dL   RDW 14.1 11.5 - 15.5 %   Platelets 348 150 - 400 K/uL  Lipase, blood     Status: Abnormal   Collection Time: 10/11/14  7:02 AM  Result Value Ref Range   Lipase 454 (H) 22 - 51 U/L    Comment: RESULTS CONFIRMED BY MANUAL DILUTION  POC urine preg, ED (not at [facility])     Status: None   Collection Time: 10/11/14  7:12 AM  Result Value Ref Range   Preg Test, Ur NEGATIVE NEGATIVE    Comment:        THE SENSITIVITY OF THIS METHODOLOGY IS >24 mIU/mL   Surgical pcr screen     Status: None   Collection Time: 10/11/14  6:57 PM  Result Value Ref Range   MRSA, PCR NEGATIVE NEGATIVE   Staphylococcus aureus NEGATIVE NEGATIVE    Comment:        The Xpert SA Assay (FDA approved for NASAL specimens in patients over 69 years of age), is one component of a comprehensive surveillance program.  Test performance has been validated by [facility] for patients greater than or equal to 57 year old. It is not intended to diagnose infection nor to guide or monitor treatment.   Comprehensive metabolic panel     Status: Abnormal   Collection Time: 10/12/14  5:05 AM  Result Value Ref Range   Sodium 139 135 - 145 mmol/L   Potassium 4.0 3.5 - 5.1 mmol/L   Chloride 110 101 - 111 mmol/L   CO2 23 22 - 32 mmol/L    Glucose, Bld 84 65 - 99 mg/dL   BUN 7 6 - 20 mg/dL   Creatinine, Ser 0.81 0.44 - 1.00 mg/dL   Calcium 8.4 (L) 8.9 - 10.3 mg/dL   Total Protein 5.9 (L) 6.5 - 8.1 g/dL   Albumin 3.0 (L) 3.5 - 5.0 g/dL   AST 52 (H) 15 - 41 U/L   ALT 66 (H) 14 - 54 U/L   Alkaline Phosphatase 89 38 - 126 U/L   Total Bilirubin 0.6 0.3 - 1.2 mg/dL   GFR calc non Af Amer >60 >60 mL/min   GFR calc Af Amer >60 >60 mL/min    Comment: (NOTE) The eGFR has been calculated using the CKD EPI equation. This calculation has not been validated in all clinical situations. eGFR's persistently <60 mL/min signify possible Chronic Kidney Disease.    Anion gap 6 5 - 15  Lipase, blood     Status: Abnormal   Collection Time: 10/12/14  5:05 AM  Result Value Ref Range   Lipase 21 (L) 22 - 51 U/L    Imaging / Studies: US Abdomen Complete  10/11/2014   CLINICAL DATA:  Abdominal and back pain  EXAM: ULTRASOUND ABDOMEN COMPLETE  COMPARISON:  None.  FINDINGS: Gallbladder: Multiple small gallstones are noted without significant gallbladder wall thickening or pericholecystic fluid.  Common bile duct: Diameter: 3.7 mm.  Liver: No focal lesion identified. Within normal limits in parenchymal echogenicity.  IVC: No abnormality visualized.  Pancreas: Visualized portion unremarkable.  Spleen: Size and appearance within normal limits.  Right Kidney: Length: 11.1 cm. Echogenicity within normal limits. No mass or hydronephrosis visualized.  Left Kidney: Length: 11.8 cm. Echogenicity within normal limits. No mass or hydronephrosis visualized.  Abdominal aorta: No aneurysm visualized.  Other findings: None.  IMPRESSION: Cholelithiasis without complicating factors.   Electronically Signed   By: Inez Catalina M.D.   On: 10/11/2014 09:28    Medications / Allergies:  Scheduled Meds: . ciprofloxacin  400 mg Intravenous To SSTC  . enoxaparin (LOVENOX)  injection  40 mg Subcutaneous Q24H  . nicotine  21 mg Transdermal Daily   Continuous  Infusions: . sodium chloride 1,000 mL (10/12/14 0230)   PRN Meds:.acetaminophen **OR** acetaminophen, alum & mag hydroxide-simeth, HYDROmorphone (DILAUDID) injection, ondansetron **OR** ondansetron (ZOFRAN) IV  Antibiotics: Anti-infectives    Start     Dose/Rate Route Frequency Ordered Stop   10/12/14 0800  ciprofloxacin (CIPRO) IVPB 400 mg     400 mg 200 mL/hr over 60 Minutes Intravenous To Short Stay 10/11/14 1815 10/13/14 0800        Assessment/Plan Mild gallstone pancreatitis-resolved Biliary colic -laparoscopic cholecystectomy today FEN-IVF, pain control VTE prophylaxis-SCD/lovenox  Dispo-to OR.  Likely home later today.      Erby Pian, [facility] Pager [phone]) For consults and floor pages call [phone](7A-4:30P)  10/12/2014 7:57 AM

## 2014-10-12 NOTE — Discharge Summary (Signed)
Patient ID: Catherine Ruiz MRN: [id number] DOB/AGE: 1991/02/09 24 y.o.  Admit date: 10/11/2014 Discharge date: 10/12/2014  Procedures: lap chole with IOC  Consults: None  Reason for Admission: Catherine Ruiz is a 24 year old female with a history appendectomy and ruptured spleen treated nonoperatively presenting with abdominal pain. Duration of symptoms is about 10 weeks. It started about 2 weeks after she had a baby. Initially presented as back pain and then radiated to the upper abdomen. Time pattern is intermittent, every other day or so. Worse after meals. Denies fever, chills or sweats. Has nausea and vomiting. Last oral intake was about 9PM last night. Modifying factors include; ibuprofen. No alleviating factors. Characterized as squeezing type pain. Her work up shows a normal WBC, increased AST/ALT, lipase 454. Abdominal US showed cholelithiasis. We have therefore been asked to evaluate.   Admission Diagnoses:  1. Gallstone pancreatitis  Hospital Course: The patient was admitted and given a day of bowel rest due to a mild pancreatitis.  This resolved well and the following day she was taken to the OR where she underwent the above procedure.  She wanted to go home on POD 0.  She was taking in oral meds and an oral diet.  She was stable for dc home.  Discharge Diagnoses:  Principal Problem:   Acute gallstone pancreatitis Active Problems:   Tobacco abuse   Cholelithiases s/p lap chole with IOC  Discharge Medications:   Medication List    TAKE these medications        cyclobenzaprine 10 MG tablet  Commonly known as:  FLEXERIL  Take 10 mg by mouth 3 (three) times daily as needed for muscle spasms.     ibuprofen 600 MG tablet  Commonly known as:  ADVIL,MOTRIN  Take 600 mg by mouth every 6 (six) hours as needed (pain).     norgestrel-ethinyl estradiol 0.3-30 MG-MCG tablet  Commonly known as:  LO/OVRAL,CRYSELLE  Take 1 tablet by mouth daily.     oxyCODONE-acetaminophen 5-325 MG per tablet  Commonly known as:  ROXICET  Take 1-2 tablets by mouth every 4 (four) hours as needed for severe pain.        Discharge Instructions:     Follow-up Information    Follow up with [facility] On 11/02/2014.   Specialty:  General Surgery   Why:  [facility], 2:45pm   Contact information:   [address] [place] [address] [phone]       Signed: Letha Cape 10/12/2014, 3:26 PM

## 2014-10-12 NOTE — Anesthesia Postprocedure Evaluation (Signed)
Anesthesia Post Note  Patient: Catherine Ruiz  Procedure(s) Performed: Procedure(s) (LRB): LAPAROSCOPIC CHOLECYSTECTOMY WITH INTRAOPERATIVE CHOLANGIOGRAM (N/A)  Anesthesia type: general  Patient location: PACU  Post pain: Pain level controlled  Post assessment: Patient's Cardiovascular Status Stable  Last Vitals:  Filed Vitals:   10/12/14 1634  BP: 130/63  Pulse: 75  Temp: 36.4 C  Resp: 17    Post vital signs: Reviewed and stable  Level of consciousness: sedated  Complications: No apparent anesthesia complications

## 2014-10-13 ENCOUNTER — Encounter (HOSPITAL_COMMUNITY): Payer: Self-pay | Admitting: General Surgery

## 2015-02-22 ENCOUNTER — Encounter: Payer: Self-pay | Admitting: Obstetrics & Gynecology

## 2015-02-22 ENCOUNTER — Ambulatory Visit (INDEPENDENT_AMBULATORY_CARE_PROVIDER_SITE_OTHER): Payer: 59 | Admitting: Obstetrics & Gynecology

## 2015-02-22 VITALS — BP 124/78 | HR 97 | Resp 18 | Ht 64.0 in | Wt 158.0 lb

## 2015-02-22 DIAGNOSIS — N6012 Diffuse cystic mastopathy of left breast: Secondary | ICD-10-CM | POA: Diagnosis not present

## 2015-02-22 DIAGNOSIS — N644 Mastodynia: Secondary | ICD-10-CM | POA: Diagnosis not present

## 2015-02-22 DIAGNOSIS — N6011 Diffuse cystic mastopathy of right breast: Secondary | ICD-10-CM | POA: Diagnosis not present

## 2015-02-22 LAB — POCT URINE PREGNANCY: Preg Test, Ur: NEGATIVE

## 2015-02-22 NOTE — Progress Notes (Signed)
   CLINIC ENCOUNTER NOTE  History:  24 y.o. [id number] here today for evaluation of bilateral breast pain for the past two weeks.  No abnormal drainage, no axillary lumps, no discrete breast lumps.  She denies any abnormal vaginal discharge, bleeding, pelvic pain or other concerns.   Past Medical History  Diagnosis Date  . Ruptured spleen   . Miscarriage   . GERD (gastroesophageal reflux disease)   . UTI (lower urinary tract infection)   . PONV (postoperative nausea and vomiting)   . Pregnancy     Past Surgical History  Procedure Laterality Date  . Mouth surgery    . Appendectomy  2001  . Cholecystectomy N/A 10/12/2014    Procedure: LAPAROSCOPIC CHOLECYSTECTOMY WITH INTRAOPERATIVE CHOLANGIOGRAM;  Surgeon: Jimmye NormanJames Wyatt, MD;  Location: [facility] OR;  Service: General;  Laterality: N/A;  . Gall bladder removed  10/12/2014   The following portions of the patient's history were reviewed and updated as appropriate: allergies, current medications, past family history, past medical history, past social history, past surgical history and problem list.   Health Maintenance:  Normal pap and negative GC and Chlam on 12/02/2013.    Review of Systems:  Pertinent items noted in HPI and remainder of comprehensive ROS otherwise negative.  Objective:  Physical Exam BP 124/78 mmHg  Pulse 97  Resp 18  Ht 5\' 4"  (1.626 m)  Wt 158 lb (71.668 kg)  BMI 27.11 kg/m2  LMP 02/14/2015 CONSTITUTIONAL: Well-developed, well-nourished female in no acute distress.  HENT:  Normocephalic, atraumatic. External right and left ear normal. Oropharynx is clear and moist EYES: Conjunctivae and EOM are normal. Pupils are equal, round, and reactive to light. No scleral icterus.  NECK: Normal range of motion, supple, no masses SKIN: Skin is warm and dry. No rash noted. Not diaphoretic. No erythema. No pallor. NEUROLGIC: Alert and oriented to person, place, and time. Normal reflexes, muscle tone coordination. No cranial nerve  deficit noted. BREASTS: Symmetric in size. Diffuse, dense breasts noted with fibrocystic changes palpated. No discrete masses, skin changes, nipple drainage, or lymphadenopathy. PSYCHIATRIC: Normal mood and affect. Normal behavior. Normal judgment and thought content. CARDIOVASCULAR: Normal heart rate noted RESPIRATORY: Effort and breath sounds normal, no problems with respiration noted ABDOMEN: Soft, no distention noted.   PELVIC: Deferred MUSCULOSKELETAL: Normal range of motion. No edema noted.  Labs and Imaging UPT negative   Assessment & Plan:  1. Breast pain 2. Fibrocystic breast changes, bilateral Patient reassured it was likely fibrocystic changes; cautioned about caffeine intake and other triggers.  NSAIDs, warm compresses recommended. Offered diagnostic mammogram but patient wanted to defer this for now; will reconsider if pain continues or worsens.  Routine preventative health maintenance measures emphasized. Please refer to After Visit Summary for other counseling recommendations.     Jaynie CollinsUGONNA  Pedro Oldenburg, MD, FACOG Attending Obstetrician & Gynecologist, [facility]

## 2015-02-22 NOTE — Patient Instructions (Signed)
Fibrocystic Breast Changes °Fibrocystic breast changes occur when breast ducts become blocked, causing painful, fluid-filled lumps (cysts) to form in the breast. This is a common condition that is noncancerous (benign). It occurs when women go through hormonal changes during their menstrual cycle. Fibrocystic breast changes can affect one or both breasts. °CAUSES  °The exact cause of fibrocystic breast changes is not known, but it may be related to the female hormones estrogen and progesterone. Family traits that get passed from parent to child (genetics) may also be a factor in some cases. °SIGNS AND SYMPTOMS  °· Tenderness, mild discomfort, or pain.   °· Swelling.   °· Rope-like feeling when touching the breast.   °· Lumpy breast, one or both sides.   °· Changes in breast size, especially before (larger) and after (smaller) the menstrual period.   °· Magana or dark brown nipple discharge (not blood).   °Symptoms are usually worse before menstrual periods start and get better toward the end of the menstrual period.  °DIAGNOSIS  °To make a diagnosis, your health care provider will ask you questions and perform a physical exam of your breasts. The health care provider may recommend other tests that can examine inside your breasts, such as: °· A breast X-ray (mammogram).   °· Ultrasonography.  °· An MRI.   °If something more than fibrocystic breast changes is suspected, your health care provider may take a breast tissue sample (breast biopsy) to examine. °TREATMENT  °Often, treatment is not needed. Your health care provider may recommend over-the-counter pain relievers to help lessen pain or discomfort caused by the fibrocystic breast changes. You may also be asked to change your diet to limit or stop eating foods or drinking beverages that contain caffeine. Foods and beverages that contain caffeine include chocolate, soda, coffee, and tea. Reducing sugar and fat in your diet may also help. Your health care provider  may also recommend: °· Fine needle aspiration to remove fluid from a cyst that is causing pain.   °· Surgery to remove a large, persistent, and tender cyst. °HOME CARE INSTRUCTIONS  °· Examine your breasts after every menstrual period. If you do not have menstrual periods, check your breasts the first day of every month. Feel for changes, such as more tenderness, a new growth, a change in breast size, or a change in a lump that has always been there.   °· Only take over-the-counter or prescription medicine as directed by your health care provider.   °· Wear a well-fitted support or sports bra, especially when exercising.   °· Decrease or avoid caffeine, fat, and sugar in your diet as directed by your health care provider.   °SEEK MEDICAL CARE IF:  °· You have fluid leaking (discharge) from your nipples, especially bloody discharge.   °· You have new lumps or bumps in the breast.   °· Your breast or breasts become enlarged, red, and painful.   °· You have areas of your breast that pucker in.   °· Your nipples appear flat or indented.   °  °This information is not intended to replace advice given to you by your health care provider. Make sure you discuss any questions you have with your health care provider. °  °Document Released: 12/20/2005 Document Revised: 11/24/2014 Document Reviewed: 08/24/2012 °Elsevier Interactive Patient Education ©2016 [employer]vier Inc. ° ° °

## 2015-02-22 NOTE — Progress Notes (Signed)
Pt came in the office today, c/o breast pain in both breast but primarily more in the left breast.  Pt states her breast are very dense, grandmother has hx of breast cancer.

## 2015-08-02 DIAGNOSIS — J029 Acute pharyngitis, unspecified: Secondary | ICD-10-CM | POA: Diagnosis not present

## 2015-08-25 ENCOUNTER — Telehealth: Payer: Self-pay | Admitting: *Deleted

## 2015-08-25 DIAGNOSIS — Z3041 Encounter for surveillance of contraceptive pills: Secondary | ICD-10-CM

## 2015-08-25 MED ORDER — NORGESTREL-ETHINYL ESTRADIOL 0.3-30 MG-MCG PO TABS: 1.0000 | ORAL_TABLET | Freq: Every day | ORAL | Status: DC

## 2015-08-25 NOTE — Telephone Encounter (Signed)
Received fax from pt pharmacy requesting refill for birth control, sent new rx to pharmacy per Dr Macon LargeAnyanwu.

## 2015-08-29 ENCOUNTER — Telehealth: Payer: Self-pay | Admitting: *Deleted

## 2015-08-29 DIAGNOSIS — Z3041 Encounter for surveillance of contraceptive pills: Secondary | ICD-10-CM

## 2015-08-29 NOTE — Telephone Encounter (Signed)
[facility], already sent rx to the mail-in pharmacy on 08-25-15.

## 2015-08-31 ENCOUNTER — Other Ambulatory Visit: Payer: Self-pay | Admitting: *Deleted

## 2015-08-31 DIAGNOSIS — Z3041 Encounter for surveillance of contraceptive pills: Secondary | ICD-10-CM

## 2015-08-31 MED ORDER — NORGESTREL-ETHINYL ESTRADIOL 0.3-30 MG-MCG PO TABS: 1.0000 | ORAL_TABLET | Freq: Every day | ORAL | Status: DC

## 2015-09-05 ENCOUNTER — Other Ambulatory Visit: Payer: Self-pay | Admitting: *Deleted

## 2015-09-05 DIAGNOSIS — Z3041 Encounter for surveillance of contraceptive pills: Secondary | ICD-10-CM

## 2015-10-14 IMAGING — US US PELVIS COMPLETE
1 series · 14 of 25 positions shown · non-contrast
Comparison: None

CLINICAL DATA: Pelvic pain.

EXAM:
TRANSABDOMINAL AND TRANSVAGINAL ULTRASOUND OF PELVIS
TECHNIQUE: Both transabdominal and transvaginal ultrasound examinations of the
pelvis were performed. Transabdominal technique was performed for
global imaging of the pelvis including uterus, ovaries, adnexal
regions, and pelvic cul-de-sac. It was necessary to proceed with
endovaginal exam following the transabdominal exam to visualize the
ovaries and endometrium.

[Series 1: us pelvis complete · 89 acquisitions, 14 frames shown]
[im 1/89]
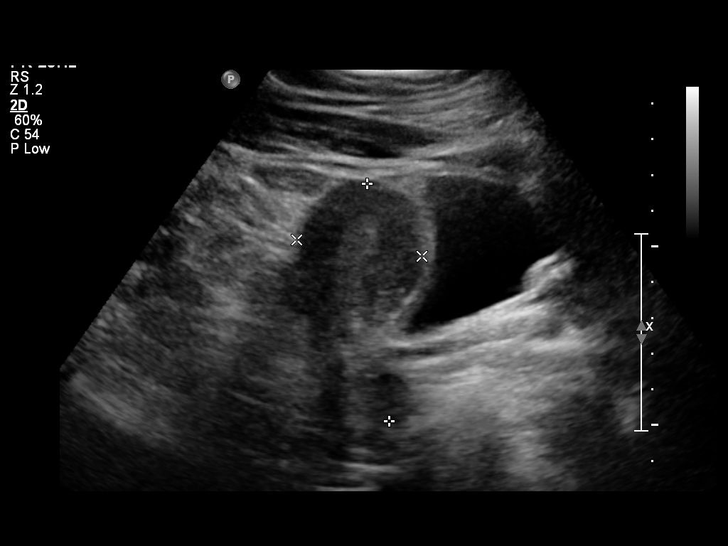
[im 8/89]
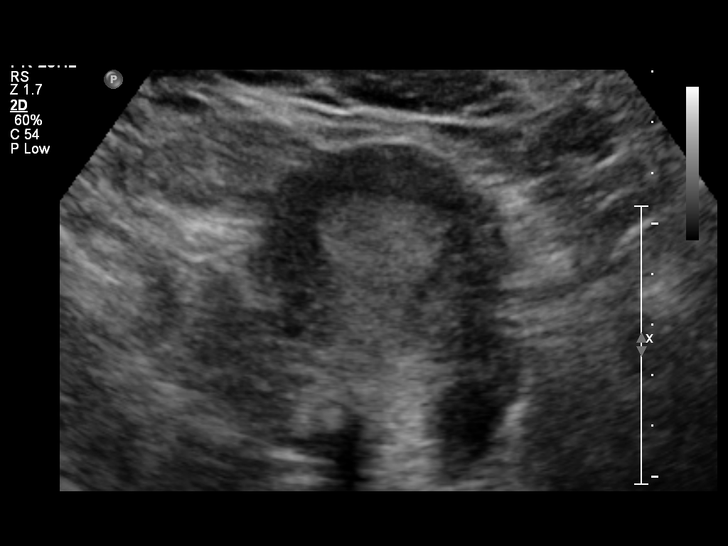
[im 15/89]
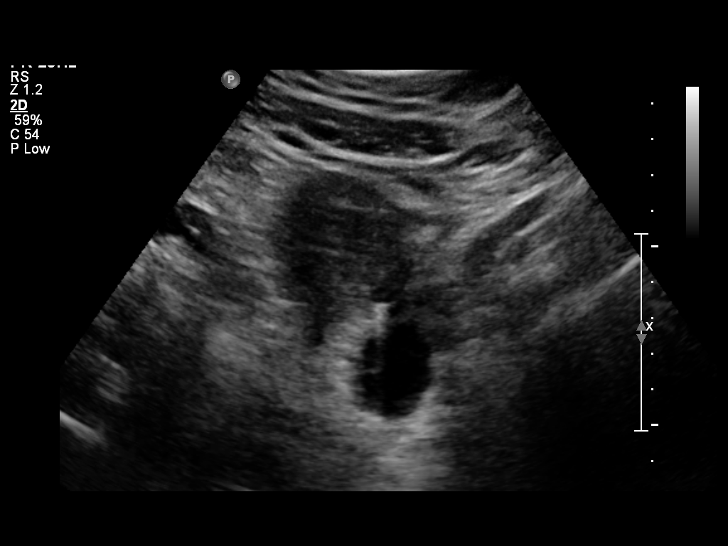
[im 23/89]
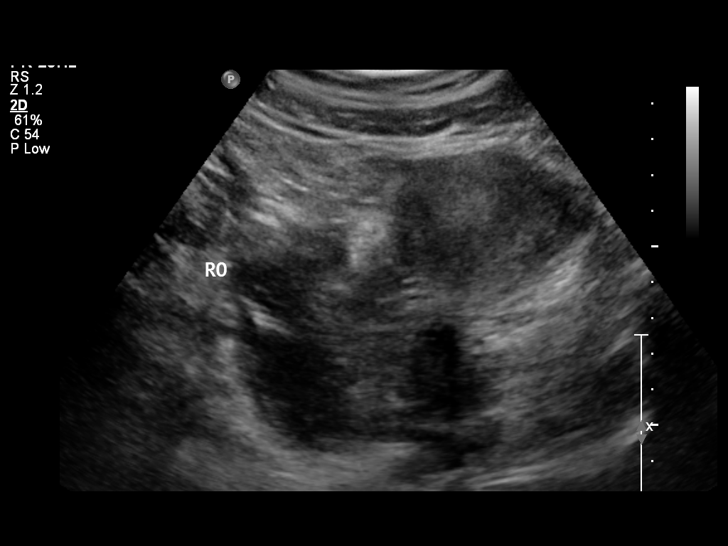
[im 30/89]
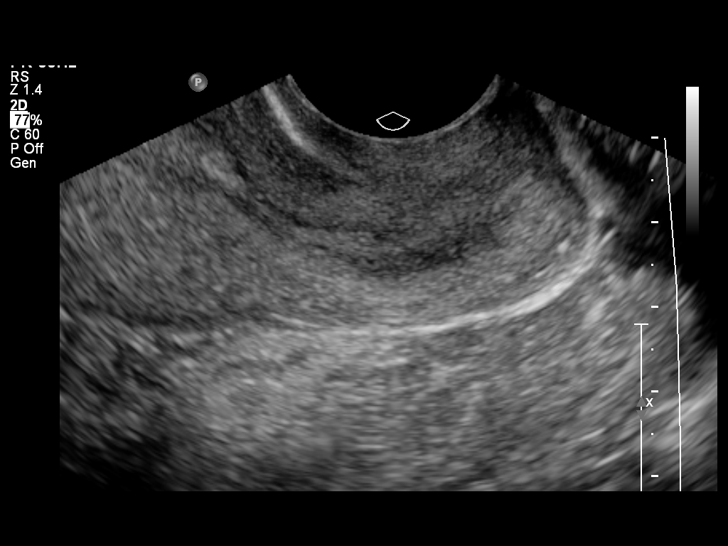
[im 34/89]
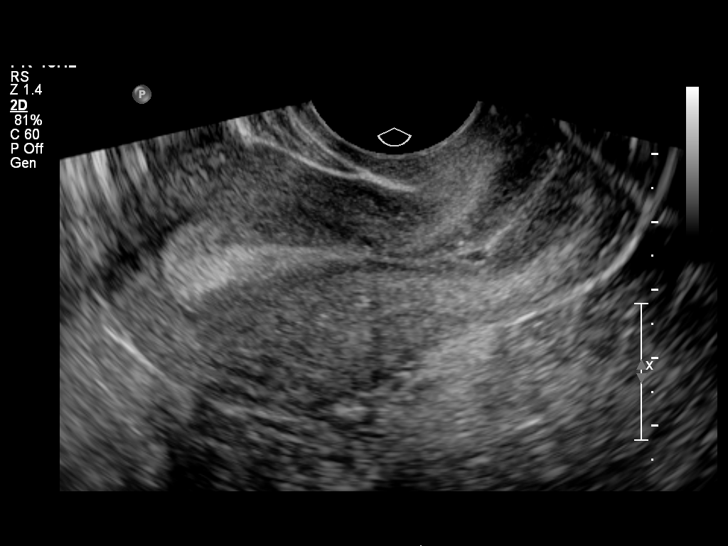
[im 41/89]
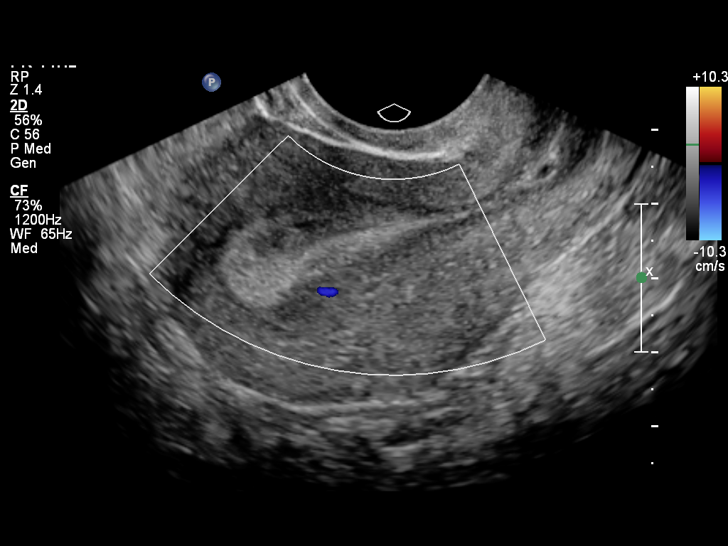
[im 48/89]
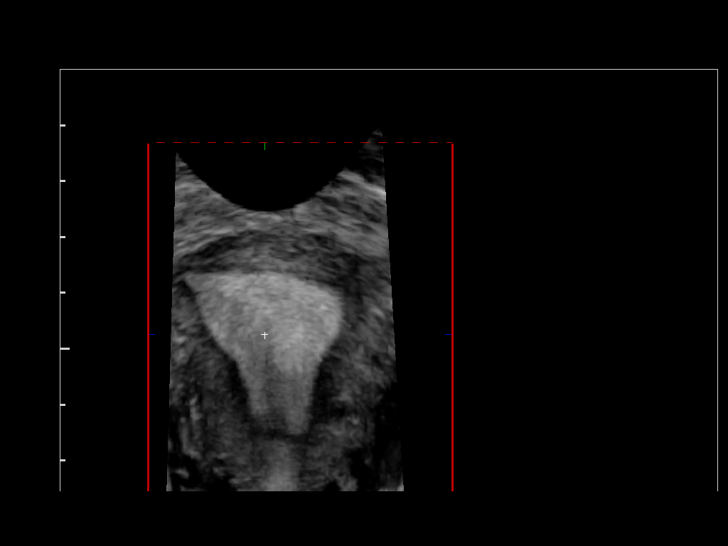
[im 56/89]
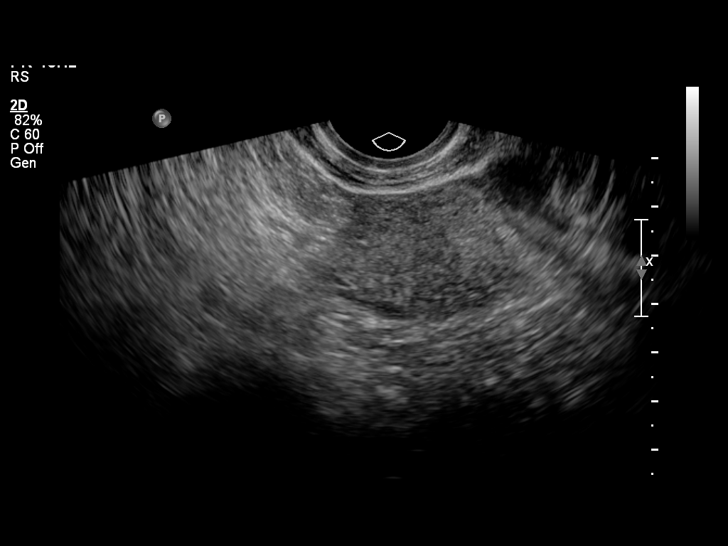
[im 59/89]
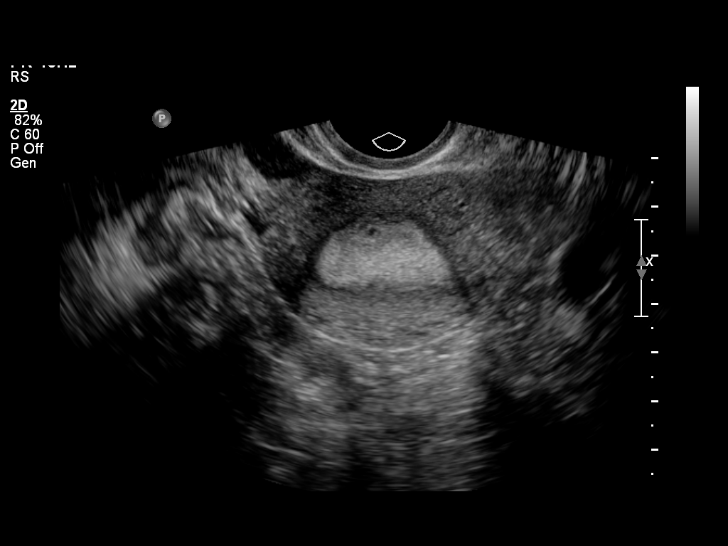
[im 67/89]
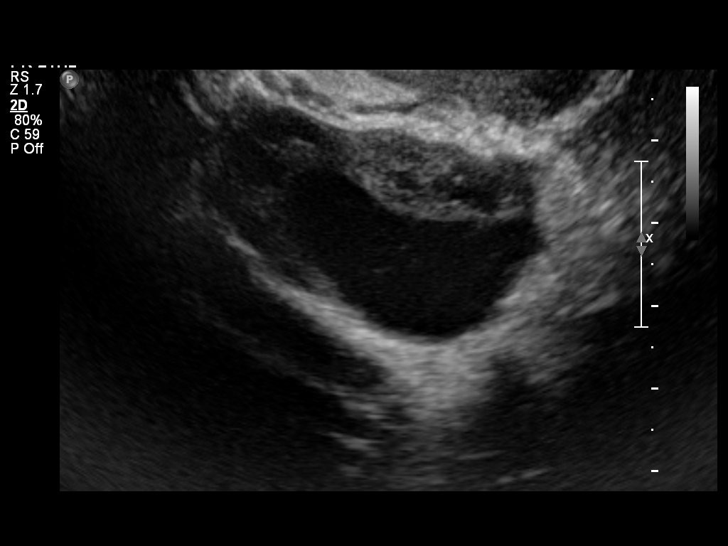
[im 74/89]
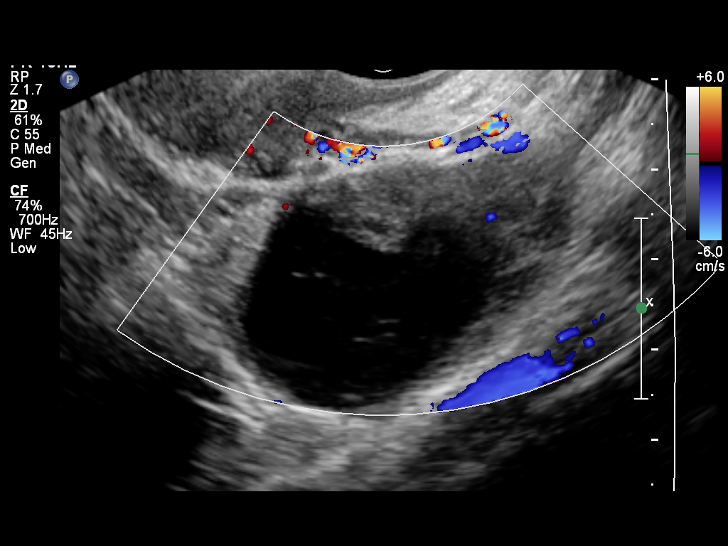
[im 81/89]
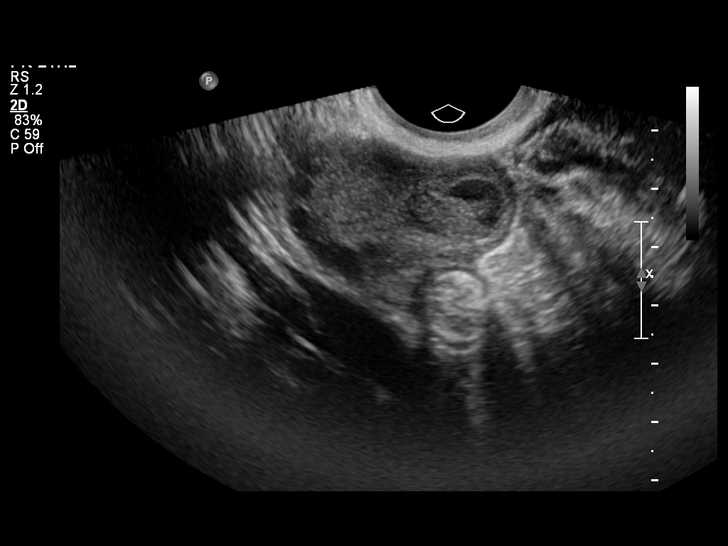
[im 89/89]
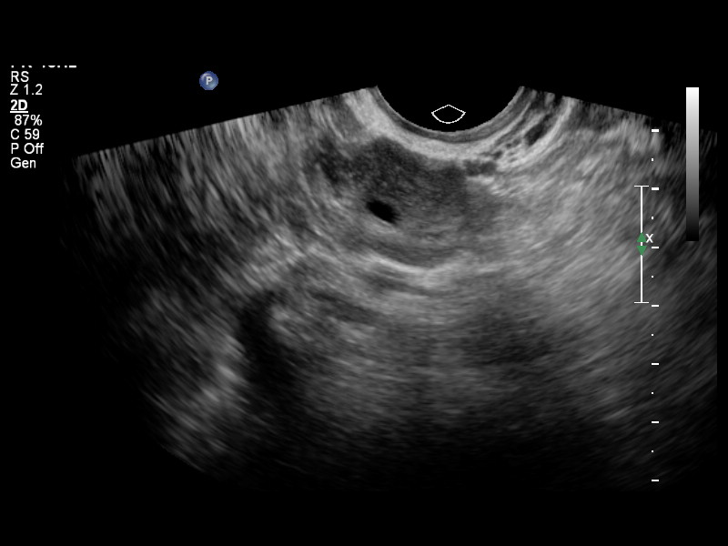

[14 of 25 positions shown; findings below may reference images not displayed]

FINDINGS: Uterus

Measurements: 7.6 x 4.1 x 4.7 cm. No fibroids or other mass
visualized.

Endometrium

Thickness: 1.2 mm.  No focal abnormality visualized.

Right ovary

Measurements: 2.9 x 1.9 x 3.9 cm. Slightly complex 1 cm cyst.

Left ovary

Measurements: 4.3 x 2.4 x 4.4 cm. 3.2 x 1.4 x 3.0 cm complex cyst,
likely hemorrhagic cyst.

Other findings

Small amount of free pelvic fluid
IMPRESSION: 1. Normal sonographic appearance of the uterus.
2. Bilateral complex/hemorrhagic ovarian cyst.
3. Small amount of free pelvic fluid.

## 2015-12-15 ENCOUNTER — Other Ambulatory Visit: Payer: Self-pay | Admitting: Obstetrics & Gynecology

## 2015-12-15 DIAGNOSIS — Z3041 Encounter for surveillance of contraceptive pills: Secondary | ICD-10-CM

## 2015-12-20 NOTE — Telephone Encounter (Signed)
[facility] per Dr Marice Potterove order.

## 2016-01-06 DIAGNOSIS — J029 Acute pharyngitis, unspecified: Secondary | ICD-10-CM | POA: Diagnosis not present

## 2016-02-16 DIAGNOSIS — J019 Acute sinusitis, unspecified: Secondary | ICD-10-CM | POA: Diagnosis not present

## 2016-02-16 DIAGNOSIS — B9689 Other specified bacterial agents as the cause of diseases classified elsewhere: Secondary | ICD-10-CM | POA: Diagnosis not present

## 2016-03-19 NOTE — L&D Delivery Note (Signed)
IOL for A2DM, pitocin, SROM.    Delivery Note After a 4 contraction 2nd stage, at 8:56 PM a viable female was delivered via Vaginal, Spontaneous (Presentation:ROA ).Loose nucal X1, reduced  APGAR: 9, 9; weight pending .   After 1 minute, the cord was clamped and cut. 40 units of pitocin diluted in 1000cc LR was infused rapidly IV.  The placenta separated spontaneously and delivered via CCT and maternal pushing effort.  It was inspected and appears to be intact with a 3 VC  Anesthesia:  epidural Episiotomy: None Lacerations:  1st degree perineal Suture Repair: 2.0 vicryl Est. Blood Loss (mL):  150  Mom to postpartum.  Baby to Couplet care / Skin to Skin.  CRESENZO-DISHMAN,Denim Kalmbach 01/31/2017, 9:14 PM    Please schedule this patient for PP visit in: 6 weeks High risk pregnancy complicated by: GDM Delivery mode:  SVD Anticipated Birth Control:  other/unsure PP Procedures needed: 2 hour GTT  Schedule Integrated BH visit: no Provider: Any provider .

## 2016-03-27 DIAGNOSIS — J069 Acute upper respiratory infection, unspecified: Secondary | ICD-10-CM | POA: Diagnosis not present

## 2016-03-27 DIAGNOSIS — K2970 Gastritis, unspecified, without bleeding: Secondary | ICD-10-CM | POA: Diagnosis not present

## 2016-05-04 DIAGNOSIS — M79642 Pain in left hand: Secondary | ICD-10-CM | POA: Diagnosis not present

## 2016-07-11 ENCOUNTER — Encounter: Payer: Self-pay | Admitting: Advanced Practice Midwife

## 2016-07-11 ENCOUNTER — Encounter: Payer: Self-pay | Admitting: Radiology

## 2016-07-11 ENCOUNTER — Ambulatory Visit (INDEPENDENT_AMBULATORY_CARE_PROVIDER_SITE_OTHER): Payer: 59 | Admitting: Advanced Practice Midwife

## 2016-07-11 VITALS — BP 119/77 | HR 96 | Wt 156.0 lb

## 2016-07-11 DIAGNOSIS — Z113 Encounter for screening for infections with a predominantly sexual mode of transmission: Secondary | ICD-10-CM

## 2016-07-11 DIAGNOSIS — Z3689 Encounter for other specified antenatal screening: Secondary | ICD-10-CM

## 2016-07-11 DIAGNOSIS — Z3481 Encounter for supervision of other normal pregnancy, first trimester: Secondary | ICD-10-CM | POA: Diagnosis not present

## 2016-07-11 DIAGNOSIS — Z3483 Encounter for supervision of other normal pregnancy, third trimester: Secondary | ICD-10-CM | POA: Diagnosis not present

## 2016-07-11 DIAGNOSIS — Z124 Encounter for screening for malignant neoplasm of cervix: Secondary | ICD-10-CM | POA: Diagnosis not present

## 2016-07-11 NOTE — Progress Notes (Signed)
   PRENATAL VISIT NOTE  Subjective:  Catherine Ruiz is a 26 y.o. G3P1011 at [redacted]w[redacted]d being seen today for initial prenatal visit.  She is currently monitored for the following issues for this low-risk pregnancy and has Tobacco abuse on her problem list.  Patient reports occasional nausea, improved this week since last week.  Contractions: Not present. Vag. Bleeding: None.  Movement: Absent. Denies leaking of fluid.   The following portions of the patient's history were reviewed and updated as appropriate: allergies, current medications, past family history, past medical history, past social history, past surgical history and problem list. Problem list updated.  Objective:   Vitals:   07/11/16 1515  BP: 119/77  Pulse: 96  Weight: 156 lb (70.8 kg)    Fetal Status: Fetal Heart Rate (bpm): 173   Movement: Absent     VS reviewed, nursing note reviewed,  Constitutional: well developed, well nourished, no distress HEENT: normocephalic CV: normal rate Pulm/chest wall: normal effort Breast Exam:  right breast normal without mass, skin or nipple changes or axillary nodes, left breast normal without mass, skin or nipple changes or axillary nodes Abdomen: soft Neuro: alert and oriented x 3 Skin: warm, dry Psych: affect normal Pelvic exam: Cervix pink, visually closed, without lesion, scant white creamy discharge, vaginal walls and external genitalia normal Bimanual exam: Cervix 0/long/high, firm, anterior, neg CMT, uterus nontender, ~ 10 week size, adnexa without tenderness, enlargement, or mass   Assessment and Plan:  Pregnancy: G3P1011 at [redacted]w[redacted]d  1. Encounter for supervision of other normal pregnancy in first trimester --Declines genetic screening --Declines medications for nausea at this time but will call office if nausea returns or desires treatment - Cytology - PAP - Obstetric Panel, Including HIV - Culture, OB Urine - Hemoglobinopathy evaluation - [place] bedside; Future  Preterm  labor symptoms and general obstetric precautions including but not limited to vaginal bleeding, contractions, leaking of fluid and fetal movement were reviewed in detail with the patient. Please refer to After Visit Summary for other counseling recommendations.  No Follow-up on file.   Hurshel Party, CNM

## 2016-07-11 NOTE — Progress Notes (Signed)
Bedside abdominal US performed, SIUP noted with + FHR 173 and CRL measuring 10w 1d which correlates with LMP.

## 2016-07-13 LAB — OBSTETRIC PANEL, INCLUDING HIV
Antibody Screen: NEGATIVE
BASOS: 0 %
Basophils Absolute: 0 10*3/uL (ref 0.0–0.2)
EOS (ABSOLUTE): 0.1 10*3/uL (ref 0.0–0.4)
Eos: 1 %
HEMATOCRIT: 35.8 % (ref 34.0–46.6)
HIV Screen 4th Generation wRfx: NONREACTIVE
Hemoglobin: 12.5 g/dL (ref 11.1–15.9)
Hepatitis B Surface Ag: NEGATIVE
Immature Grans (Abs): 0 10*3/uL (ref 0.0–0.1)
Immature Granulocytes: 0 %
Lymphocytes Absolute: 2.1 10*3/uL (ref 0.7–3.1)
Lymphs: 20 %
MCH: 30.6 pg (ref 26.6–33.0)
MCHC: 34.9 g/dL (ref 31.5–35.7)
MCV: 88 fL (ref 79–97)
MONOCYTES: 5 %
Monocytes Absolute: 0.6 10*3/uL (ref 0.1–0.9)
Neutrophils Absolute: 7.7 10*3/uL — ABNORMAL HIGH (ref 1.4–7.0)
Neutrophils: 74 %
Platelets: 345 10*3/uL (ref 150–379)
RBC: 4.08 x10E6/uL (ref 3.77–5.28)
RDW: 13.6 % (ref 12.3–15.4)
RPR Ser Ql: NONREACTIVE
RUBELLA: 3.45 {index} (ref >0.99)
Rh Factor: POSITIVE
WBC: 10.4 10*3/uL (ref 3.4–10.8)

## 2016-07-13 LAB — HEMOGLOBINOPATHY EVALUATION
HEMOGLOBIN F QUANTITATION: 2.8 % — AB (ref 0.0–2.0)
HGB A: 94.9 % — AB (ref 96.4–98.8)
HGB C: 0 %
HGB S: 0 %
HGB VARIANT: 0 %
Hemoglobin A2 Quantitation: 2.3 % (ref 1.8–3.2)

## 2016-07-13 LAB — CYTOLOGY - PAP
Chlamydia: NEGATIVE
Diagnosis: NEGATIVE
Neisseria Gonorrhea: NEGATIVE

## 2016-07-15 LAB — CULTURE, OB URINE

## 2016-07-15 LAB — URINE CULTURE, OB REFLEX

## 2016-07-25 ENCOUNTER — Ambulatory Visit (INDEPENDENT_AMBULATORY_CARE_PROVIDER_SITE_OTHER): Payer: 59 | Admitting: Student

## 2016-07-25 DIAGNOSIS — Z3481 Encounter for supervision of other normal pregnancy, first trimester: Secondary | ICD-10-CM

## 2016-07-25 DIAGNOSIS — Z3480 Encounter for supervision of other normal pregnancy, unspecified trimester: Secondary | ICD-10-CM

## 2016-07-25 DIAGNOSIS — G44019 Episodic cluster headache, not intractable: Secondary | ICD-10-CM

## 2016-07-25 DIAGNOSIS — R51 Headache: Secondary | ICD-10-CM

## 2016-07-25 DIAGNOSIS — R519 Headache, unspecified: Secondary | ICD-10-CM | POA: Insufficient documentation

## 2016-07-25 MED ORDER — BUTALBITAL-APAP-CAFFEINE 50-325-40 MG PO TABS: 1.0000 | ORAL_TABLET | Freq: Four times a day (QID) | ORAL | 0 refills | Status: DC | PRN

## 2016-07-25 NOTE — Progress Notes (Signed)
   PRENATAL VISIT NOTE  Subjective:  Catherine Ruiz is a 26 y.o. G3P1011 at 3179w6d being seen today for ongoing prenatal care.  She is currently monitored for the following issues for this low-risk pregnancy and has Tobacco abuse; Supervision of other normal pregnancy, antepartum; and Headache on her problem list.  Patient reports headache.  Contractions: Not present. Vag. Bleeding: None.  Movement: Absent. Denies leaking of fluid.   Patient states that she gets about two really painful headaches a week, the last one being 5 days ago. They are frontal and are described as throbbing. They last about 30-45 minutes. She would like some medication for them as Tylenol sometimes doesn't always work. She says she will make an appointment to see Teague-Clark if they get worse but would like to try another medication first.   Her nausea and vomiting from earlier in her pregnancy is now gone.   The following portions of the patient's history were reviewed and updated as appropriate: allergies, current medications, past family history, past medical history, past social history, past surgical history and problem list. Problem list updated.  Objective:   Vitals:   07/25/16 1508  BP: 122/76  Pulse: (!) 112  Weight: 156 lb (70.8 kg)    Fetal Status: Fetal Heart Rate (bpm): 155   Movement: Absent     General:  Alert, oriented and cooperative. Patient is in no acute distress.  Skin: Skin is warm and dry. No rash noted.   Cardiovascular: Normal heart rate noted  Respiratory: Normal respiratory effort, no problems with respiration noted  Abdomen: Soft, gravid, appropriate for gestational age. Pain/Pressure: Absent     Pelvic:  Cervical exam deferred        Extremities: Normal range of motion.  Edema: None  Mental Status: Normal mood and affect. Normal behavior. Normal judgment and thought content.   Assessment and Plan:  Pregnancy: G3P1011 at 4779w6d  1. Supervision of other normal pregnancy,  antepartum  - US MFM OB COMP + 14 WK; Future  2. Episodic cluster headache, not intractable RX for fioricet given; patient knows to call and make an appointment with Dr. Glyn Adeeague-Clark if they do not get better.   Preterm labor symptoms and general obstetric precautions including but not limited to vaginal bleeding, contractions, leaking of fluid and fetal movement were reviewed in detail with the patient. Please refer to After Visit Summary for other counseling recommendations.  Return in about 4 weeks (around 08/22/2016) for ROB.   Marylene LandKooistra, Kathryn Lorraine, CNM

## 2016-07-25 NOTE — Progress Notes (Signed)
Pt c/o intense headaches with vision changes that occurs occasionally, not daily. She would like to see Karen TeagueNada Maclachlan-Clark for headache mgmt. She states she would like headache medication called in to pharmacy to hold her over until she can see Clydie BraunKaren

## 2016-08-22 ENCOUNTER — Ambulatory Visit (INDEPENDENT_AMBULATORY_CARE_PROVIDER_SITE_OTHER): Payer: 59 | Admitting: Obstetrics and Gynecology

## 2016-08-22 VITALS — BP 104/71 | HR 114 | Wt 155.0 lb

## 2016-08-22 DIAGNOSIS — R002 Palpitations: Secondary | ICD-10-CM | POA: Diagnosis not present

## 2016-08-22 DIAGNOSIS — Z3482 Encounter for supervision of other normal pregnancy, second trimester: Secondary | ICD-10-CM | POA: Diagnosis not present

## 2016-08-22 DIAGNOSIS — Z3480 Encounter for supervision of other normal pregnancy, unspecified trimester: Secondary | ICD-10-CM

## 2016-08-22 NOTE — Progress Notes (Signed)
Prenatal Visit Note Date: 08/22/2016 Clinic: [facility]  Subjective:  Catherine SchilderMichelle L Levett is a 26 y.o. G3P1011 at 10829w6d being seen today for ongoing prenatal care.  She is currently monitored for the following issues for this low-risk pregnancy and has Tobacco abuse; Supervision of other normal pregnancy, antepartum; Headache; and Heart palpitations on her problem list.  Patient reports that for the past two weeks she's noticed heart palpitations.  She states she had them with her last pregnancy and they went away after delivery but they weren't this bad.  She states she notices it when she stands up and sometimes feels tired/weak.  No chest pain, sob, nausea, vomiting, preterm labor s/s. She states that she eats probably too much salt, doesn't have any morning sickness s/s and is staying well hydrated and eating fine and regularly.   Contractions: Not present. Vag. Bleeding: None.  Movement: Absent. Denies leaking of fluid.   The following portions of the patient's history were reviewed and updated as appropriate: allergies, current medications, past family history, past medical history, past social history, past surgical history and problem list. Problem list updated.  Objective:   Vitals:   08/22/16 1522  BP: 104/71  Pulse: (!) 114  Weight: 155 lb (70.3 kg)    Fetal Status: Fetal Heart Rate (bpm): 154   Movement: Absent     General:  Alert, oriented and cooperative. Patient is in no acute distress.  Skin: Skin is warm and dry. No rash noted.   Cardiovascular: Normal heart rate noted. No MRGs, normal s1 and s2. HR 90s  Respiratory: CTAB. Normal respiratory effort, no problems with respiration noted  Abdomen: Soft, gravid, appropriate for gestational age. Pain/Pressure: Present     Pelvic:  Cervical exam deferred        Extremities: Normal range of motion.  Edema: None  Mental Status: Normal mood and affect. Normal behavior. Normal judgment and thought content.    HEENT: normal thyroid on palpation Urinalysis:      Assessment and Plan:  Pregnancy: G3P1011 at 4229w6d  1. Heart palpitations D/w her that I recommend doing a lab work up and seeing Cardiology about consideration for Holter monitor placement and possibly an echo. She's not wanting to see Cardiology but is okay with doing a lab work up and will consider doing the ECG.  I told her that if she changes her mind to definitely let us and especially if her s/s worsen. CBC at new OB normal - Thyroid Panel With TSH - Basic metabolic panel - Magnesium - EKG 12-Lead  2. Pregnancy Routine care. Declines genetic testing. Anatomy u/s already scheduled.    Preterm labor symptoms and general obstetric precautions including but not limited to vaginal bleeding, contractions, leaking of fluid and fetal movement were reviewed in detail with the patient. Please refer to After Visit Summary for other counseling recommendations.  Return in about 4 weeks (around 09/19/2016) for rob.   [place] BingPickens, Jalan Bodi, MD

## 2016-08-23 LAB — BASIC METABOLIC PANEL
BUN/Creatinine Ratio: 9 (ref 9–23)
BUN: 5 mg/dL — ABNORMAL LOW (ref 6–20)
CALCIUM: 9.5 mg/dL (ref 8.7–10.2)
CO2: 21 mmol/L (ref 18–29)
CREATININE: 0.53 mg/dL — AB (ref 0.57–1.00)
Chloride: 103 mmol/L (ref 96–106)
GFR calc non Af Amer: 133 mL/min/{1.73_m2} (ref >59)
GFR, EST AFRICAN AMERICAN: 154 mL/min/{1.73_m2} (ref >59)
Glucose: 95 mg/dL (ref 65–99)
POTASSIUM: 3.7 mmol/L (ref 3.5–5.2)
Sodium: 138 mmol/L (ref 134–144)

## 2016-08-23 LAB — THYROID PANEL WITH TSH
Free Thyroxine Index: 1.4 (ref 1.2–4.9)
T3 Uptake Ratio: 13 % — ABNORMAL LOW (ref 24–39)
T4, Total: 11 ug/dL (ref 4.5–12.0)
TSH: 1.85 u[IU]/mL (ref 0.450–4.500)

## 2016-08-23 LAB — MAGNESIUM: Magnesium: 2 mg/dL (ref 1.6–2.3)

## 2016-08-28 ENCOUNTER — Telehealth: Payer: Self-pay | Admitting: *Deleted

## 2016-08-28 NOTE — Telephone Encounter (Signed)
-----   Message from [place]ask[place] Bingharlie Pickens, MD sent at 08/23/2016  1:46 PM EDT ----- Can you let her know that these are all normal? thanks

## 2016-08-28 NOTE — Telephone Encounter (Signed)
Called pt, no answer, left VM that labs were WDL and to call back if she had any questions.

## 2016-09-13 ENCOUNTER — Other Ambulatory Visit: Payer: Self-pay | Admitting: Student

## 2016-09-13 ENCOUNTER — Ambulatory Visit (HOSPITAL_COMMUNITY)
Admission: RE | Admit: 2016-09-13 | Discharge: 2016-09-13 | Disposition: A | Discharge status: Home / Self Care | Payer: 59 | Source: Ambulatory Visit | Attending: Student | Admitting: Student

## 2016-09-13 DIAGNOSIS — Z3480 Encounter for supervision of other normal pregnancy, unspecified trimester: Secondary | ICD-10-CM | POA: Diagnosis present

## 2016-09-13 DIAGNOSIS — Z363 Encounter for antenatal screening for malformations: Secondary | ICD-10-CM | POA: Diagnosis not present

## 2016-09-13 DIAGNOSIS — Z3689 Encounter for other specified antenatal screening: Secondary | ICD-10-CM

## 2016-09-13 DIAGNOSIS — Z3A19 19 weeks gestation of pregnancy: Secondary | ICD-10-CM

## 2016-09-13 DIAGNOSIS — Z3482 Encounter for supervision of other normal pregnancy, second trimester: Secondary | ICD-10-CM | POA: Diagnosis not present

## 2016-09-17 ENCOUNTER — Encounter: Payer: 59 | Admitting: Obstetrics & Gynecology

## 2016-09-25 ENCOUNTER — Ambulatory Visit (INDEPENDENT_AMBULATORY_CARE_PROVIDER_SITE_OTHER): Payer: 59 | Admitting: Obstetrics and Gynecology

## 2016-09-25 VITALS — BP 97/62 | HR 103 | Wt 158.0 lb

## 2016-09-25 DIAGNOSIS — Z3480 Encounter for supervision of other normal pregnancy, unspecified trimester: Secondary | ICD-10-CM

## 2016-09-25 DIAGNOSIS — Z3482 Encounter for supervision of other normal pregnancy, second trimester: Secondary | ICD-10-CM

## 2016-09-25 NOTE — Progress Notes (Signed)
Prenatal Visit Note Date: 09/25/2016 Clinic: [facility]  Subjective:  Catherine SchilderMichelle L Haff is a 26 y.o. G3P1011 at 761w5d being seen today for ongoing prenatal care.  She is currently monitored for the following issues for this low-risk pregnancy and has Tobacco abuse; Supervision of other normal pregnancy, antepartum; Headache; and Heart palpitations on her problem list.  Patient reports no complaints.   Contractions: Not present. Vag. Bleeding: None.  Movement: Present. Denies leaking of fluid.   The following portions of the patient's history were reviewed and updated as appropriate: allergies, current medications, past family history, past medical history, past social history, past surgical history and problem list. Problem list updated.  Objective:   Vitals:   09/25/16 1457  BP: 97/62  Pulse: (!) 103  Weight: 158 lb (71.7 kg)    Fetal Status: Fetal Heart Rate (bpm): 154   Movement: Present     General:  Alert, oriented and cooperative. Patient is in no acute distress.  Skin: Skin is warm and dry. No rash noted.   Cardiovascular: Normal heart rate noted  Respiratory: Normal respiratory effort, no problems with respiration noted  Abdomen: Soft, gravid, appropriate for gestational age. Pain/Pressure: Absent     Pelvic:  Cervical exam deferred        Extremities: Normal range of motion.  Edema: None  Mental Status: Normal mood and affect. Normal behavior. Normal judgment and thought content.   Urinalysis:      Assessment and Plan:  Pregnancy: G3P1011 at 4961w5d  1. Supervision of other normal pregnancy, antepartum No issues. No CV s/s since last visit  Preterm labor symptoms and general obstetric precautions including but not limited to vaginal bleeding, contractions, leaking of fluid and fetal movement were reviewed in detail with the patient. Please refer to After Visit Summary for other counseling recommendations.  Return in about 3 weeks (around 10/16/2016)  for 3-4wk rob.   [place] BingPickens, Leia Coletti, MD

## 2016-10-23 ENCOUNTER — Ambulatory Visit (INDEPENDENT_AMBULATORY_CARE_PROVIDER_SITE_OTHER): Payer: 59 | Admitting: Obstetrics & Gynecology

## 2016-10-23 VITALS — BP 115/65 | HR 105 | Wt 163.0 lb

## 2016-10-23 DIAGNOSIS — Z3482 Encounter for supervision of other normal pregnancy, second trimester: Secondary | ICD-10-CM

## 2016-10-23 DIAGNOSIS — Z3480 Encounter for supervision of other normal pregnancy, unspecified trimester: Secondary | ICD-10-CM

## 2016-10-23 NOTE — Progress Notes (Signed)
   PRENATAL VISIT NOTE  Subjective:  Catherine Ruiz is a 26 y.o. G3P1011 at 7836w5d being seen today for ongoing prenatal care.  She is currently monitored for the following issues for this low-risk pregnancy and has Tobacco abuse; Supervision of other normal pregnancy, antepartum; Headache; and Heart palpitations on her problem list.  Patient reports no complaints.  Contractions: Not present. Vag. Bleeding: None.  Movement: Present. Denies leaking of fluid.   The following portions of the patient's history were reviewed and updated as appropriate: allergies, current medications, past family history, past medical history, past social history, past surgical history and problem list. Problem list updated.  Objective:   Vitals:   10/23/16 1459  BP: 115/65  Pulse: (!) 105  Weight: 163 lb (73.9 kg)    Fetal Status: Fetal Heart Rate (bpm): 148 Fundal Height: 25 cm Movement: Present     General:  Alert, oriented and cooperative. Patient is in no acute distress.  Skin: Skin is warm and dry. No rash noted.   Cardiovascular: Normal heart rate noted  Respiratory: Normal respiratory effort, no problems with respiration noted  Abdomen: Soft, gravid, appropriate for gestational age.  Pain/Pressure: Present     Pelvic: Cervical exam deferred        Extremities: Normal range of motion.  Edema: None  Mental Status:  Normal mood and affect. Normal behavior. Normal judgment and thought content.   Assessment and Plan:  Pregnancy: G3P1011 at 4336w5d  1. Supervision of other normal pregnancy, antepartum Preterm labor symptoms and general obstetric precautions including but not limited to vaginal bleeding, contractions, leaking of fluid and fetal movement were reviewed in detail with the patient. Please refer to After Visit Summary for other counseling recommendations.  Return in about 4 weeks (around 11/20/2016) for 2 hr GTT, 3rd trimester labs, TDap, OB Visit (HOB).   Jaynie CollinsUgonna Oakley Orban, MD

## 2016-10-23 NOTE — Patient Instructions (Signed)
Return to clinic for any scheduled appointments or obstetric concerns, or go to [facility] for evaluation  Tdap Vaccine (Tetanus, Diphtheria and Pertussis): What You Need to Know 1. Why get vaccinated? Tetanus, diphtheria and pertussis are very serious diseases. Tdap vaccine can protect us from these diseases. And, Tdap vaccine given to pregnant women can protect newborn babies against pertussis. TETANUS (Lockjaw) is rare in the [place] today. It causes painful muscle tightening and stiffness, usually all over the body.  It can lead to tightening of muscles in the head and neck so you can't open your mouth, swallow, or sometimes even breathe. Tetanus kills about 1 out of 10 people who are infected even after receiving the best medical care. DIPHTHERIA is also rare in the [place] today. It can cause a thick coating to form in the back of the throat.  It can lead to breathing problems, heart failure, paralysis, and death. PERTUSSIS (Whooping Cough) causes severe coughing spells, which can cause difficulty breathing, vomiting and disturbed sleep.  It can also lead to weight loss, incontinence, and rib fractures. Up to 2 in 100 adolescents and 5 in 100 adults with pertussis are hospitalized or have complications, which could include pneumonia or death. These diseases are caused by bacteria. Diphtheria and pertussis are spread from person to person through secretions from coughing or sneezing. Tetanus enters the body through cuts, scratches, or wounds. Before vaccines, as many as 200,000 cases of diphtheria, 200,000 cases of pertussis, and hundreds of cases of tetanus, were reported in the [place] each year. Since vaccination began, reports of cases for tetanus and diphtheria have dropped by about 99% and for pertussis by about 80%. 2. Tdap vaccine Tdap vaccine can protect adolescents and adults from tetanus, diphtheria, and pertussis. One dose of Tdap is routinely given at age 11 or 12.  People who did not get Tdap at that age should get it as soon as possible. Tdap is especially important for healthcare professionals and anyone having close contact with a baby younger than 12 months. Pregnant women should get a dose of Tdap during every pregnancy, to protect the newborn from pertussis. Infants are most at risk for severe, life-threatening complications from pertussis. Another vaccine, called Td, protects against tetanus and diphtheria, but not pertussis. A Td booster should be given every 10 years. Tdap may be given as one of these boosters if you have never gotten Tdap before. Tdap may also be given after a severe cut or burn to prevent tetanus infection. Your doctor or the person giving you the vaccine can give you more information. Tdap may safely be given at the same time as other vaccines. 3. Some people should not get this vaccine  A person who has ever had a life-threatening allergic reaction after a previous dose of any diphtheria, tetanus or pertussis containing vaccine, OR has a severe allergy to any part of this vaccine, should not get Tdap vaccine. Tell the person giving the vaccine about any severe allergies.  Anyone who had coma or long repeated seizures within 7 days after a childhood dose of DTP or DTaP, or a previous dose of Tdap, should not get Tdap, unless a cause other than the vaccine was found. They can still get Td.  Talk to your doctor if you:  have seizures or another nervous system problem,  had severe pain or swelling after any vaccine containing diphtheria, tetanus or pertussis,  ever had a condition called Guillain-Barr Syndrome (GBS),  aren't feeling   well on the day the shot is scheduled. 4. Risks With any medicine, including vaccines, there is a chance of side effects. These are usually mild and go away on their own. Serious reactions are also possible but are rare. Most people who get Tdap vaccine do not have any problems with it. Mild  problems following Tdap:  (Did not interfere with activities)  Pain where the shot was given (about 3 in 4 adolescents or 2 in 3 adults)  Redness or swelling where the shot was given (about 1 person in 5)  Mild fever of at least 100.4F (up to about 1 in 25 adolescents or 1 in 100 adults)  Headache (about 3 or 4 people in 10)  Tiredness (about 1 person in 3 or 4)  Nausea, vomiting, diarrhea, stomach ache (up to 1 in 4 adolescents or 1 in 10 adults)  Chills, sore joints (about 1 person in 10)  Body aches (about 1 person in 3 or 4)  Rash, swollen glands (uncommon) Moderate problems following Tdap:  (Interfered with activities, but did not require medical attention)  Pain where the shot was given (up to 1 in 5 or 6)  Redness or swelling where the shot was given (up to about 1 in 16 adolescents or 1 in 12 adults)  Fever over 102F (about 1 in 100 adolescents or 1 in 250 adults)  Headache (about 1 in 7 adolescents or 1 in 10 adults)  Nausea, vomiting, diarrhea, stomach ache (up to 1 or 3 people in 100)  Swelling of the entire arm where the shot was given (up to about 1 in 500). Severe problems following Tdap:  (Unable to perform usual activities; required medical attention)  Swelling, severe pain, bleeding and redness in the arm where the shot was given (rare). Problems that could happen after any vaccine:   People sometimes faint after a medical procedure, including vaccination. Sitting or lying down for about 15 minutes can help prevent fainting, and injuries caused by a fall. Tell your doctor if you feel dizzy, or have vision changes or ringing in the ears.  Some people get severe pain in the shoulder and have difficulty moving the arm where a shot was given. This happens very rarely.  Any medication can cause a severe allergic reaction. Such reactions from a vaccine are very rare, estimated at fewer than 1 in a million doses, and would happen within a few minutes to a few  hours after the vaccination. As with any medicine, there is a very remote chance of a vaccine causing a serious injury or death. The safety of vaccines is always being monitored. For more information, visit: www.cdc.gov/vaccinesafety/ 5. What if there is a serious problem? What should I look for?  Look for anything that concerns you, such as signs of a severe allergic reaction, very high fever, or unusual behavior. Signs of a severe allergic reaction can include hives, swelling of the face and throat, difficulty breathing, a fast heartbeat, dizziness, and weakness. These would usually start a few minutes to a few hours after the vaccination. What should I do?   If you think it is a severe allergic reaction or other emergency that can't wait, call 9-1-1 or get the person to the nearest hospital. Otherwise, call your doctor.  Afterward, the reaction should be reported to the Vaccine Adverse Event Reporting System (VAERS). Your doctor might file this report, or you can do it yourself through the VAERS web site at www.vaers.hhs.gov, or by calling   1-800-822-7967.  VAERS does not give medical advice. 6. The National Vaccine Injury Compensation Program The National Vaccine Injury Compensation Program (VICP) is a federal program that was created to compensate people who may have been injured by certain vaccines. Persons who believe they may have been injured by a vaccine can learn about the program and about filing a claim by calling 1-800-338-2382 or visiting the VICP website at www.hrsa.gov/vaccinecompensation. There is a time limit to file a claim for compensation. 7. How can I learn more?  Ask your doctor. He or she can give you the vaccine package insert or suggest other sources of information.  Call your local or state health department.  Contact the Centers for Disease Control and Prevention (CDC):  Call 1-800-232-4636 (1-800-CDC-INFO) or  Visit CDC's website at www.cdc.gov/vaccines CDC  Tdap Vaccine VIS (05/12/13) This information is not intended to replace advice given to you by your health care provider. Make sure you discuss any questions you have with your health care provider. Document Released: 09/04/2011 Document Revised: 11/24/2015 Document Reviewed: 11/24/2015 Elsevier Interactive Patient Education  2017 [employer]  

## 2016-11-23 ENCOUNTER — Ambulatory Visit (INDEPENDENT_AMBULATORY_CARE_PROVIDER_SITE_OTHER): Payer: 59 | Admitting: Obstetrics & Gynecology

## 2016-11-23 VITALS — BP 117/76 | HR 106 | Wt 165.2 lb

## 2016-11-23 DIAGNOSIS — Z3480 Encounter for supervision of other normal pregnancy, unspecified trimester: Secondary | ICD-10-CM | POA: Diagnosis not present

## 2016-11-23 DIAGNOSIS — Z3483 Encounter for supervision of other normal pregnancy, third trimester: Secondary | ICD-10-CM | POA: Diagnosis not present

## 2016-11-23 DIAGNOSIS — Z23 Encounter for immunization: Secondary | ICD-10-CM

## 2016-11-23 NOTE — Patient Instructions (Signed)
Return to clinic for any scheduled appointments or obstetric concerns, or go to [facility] for evaluation   Third Trimester of Pregnancy The third trimester is from week 28 through week 40 (months 7 through 9). The third trimester is a time when the unborn baby (fetus) is growing rapidly. At the end of the ninth month, the fetus is about 20 inches in length and weighs 6-10 pounds. Body changes during your third trimester Your body will continue to go through many changes during pregnancy. The changes vary from woman to woman. During the third trimester:  Your weight will continue to increase. You can expect to gain 25-35 pounds (11-16 kg) by the end of the pregnancy.  You may begin to get stretch marks on your hips, abdomen, and breasts.  You may urinate more often because the fetus is moving lower into your pelvis and pressing on your bladder.  You may develop or continue to have heartburn. This is caused by increased hormones that slow down muscles in the digestive tract.  You may develop or continue to have constipation because increased hormones slow digestion and cause the muscles that push waste through your intestines to relax.  You may develop hemorrhoids. These are swollen veins (varicose veins) in the rectum that can itch or be painful.  You may develop swollen, bulging veins (varicose veins) in your legs.  You may have increased body aches in the pelvis, back, or thighs. This is due to weight gain and increased hormones that are relaxing your joints.  You may have changes in your hair. These can include thickening of your hair, rapid growth, and changes in texture. Some women also have hair loss during or after pregnancy, or hair that feels dry or thin. Your hair will most likely return to normal after your baby is born.  Your breasts will continue to grow and they will continue to become tender. A yellow fluid (colostrum) may leak from your breasts. This is the first milk you are  producing for your baby.  Your belly button may stick out.  You may notice more swelling in your hands, face, or ankles.  You may have increased tingling or numbness in your hands, arms, and legs. The skin on your belly may also feel numb.  You may feel short of breath because of your expanding uterus.  You may have more problems sleeping. This can be caused by the size of your belly, increased need to urinate, and an increase in your body's metabolism.  You may notice the fetus "dropping," or moving lower in your abdomen (lightening).  You may have increased vaginal discharge.  You may notice your joints feel loose and you may have pain around your pelvic bone.  What to expect at prenatal visits You will have prenatal exams every 2 weeks until week 36. Then you will have weekly prenatal exams. During a routine prenatal visit:  You will be weighed to make sure you and the baby are growing normally.  Your blood pressure will be taken.  Your abdomen will be measured to track your baby's growth.  The fetal heartbeat will be listened to.  Any test results from the previous visit will be discussed.  You may have a cervical check near your due date to see if your cervix has softened or thinned (effaced).  You will be tested for Group B streptococcus. This happens between 35 and 37 weeks.  Your health care provider may ask you:  What your birth plan is.    How you are feeling.  If you are feeling the baby move.  If you have had any abnormal symptoms, such as leaking fluid, bleeding, severe headaches, or abdominal cramping.  If you are using any tobacco products, including cigarettes, chewing tobacco, and electronic cigarettes.  If you have any questions.  Other tests or screenings that may be performed during your third trimester include:  Blood tests that check for low iron levels (anemia).  Fetal testing to check the health, activity level, and growth of the fetus.  Testing is done if you have certain medical conditions or if there are problems during the pregnancy.  Nonstress test (NST). This test checks the health of your baby to make sure there are no signs of problems, such as the baby not getting enough oxygen. During this test, a belt is placed around your belly. The baby is made to move, and its heart rate is monitored during movement.  What is false labor? False labor is a condition in which you feel small, irregular tightenings of the muscles in the womb (contractions) that usually go away with rest, changing position, or drinking water. These are called Braxton Hicks contractions. Contractions may last for hours, days, or even weeks before true labor sets in. If contractions come at regular intervals, become more frequent, increase in intensity, or become painful, you should see your health care provider. What are the signs of labor?  Abdominal cramps.  Regular contractions that start at 10 minutes apart and become stronger and more frequent with time.  Contractions that start on the top of the uterus and spread down to the lower abdomen and back.  Increased pelvic pressure and dull back pain.  A watery or bloody mucus discharge that comes from the vagina.  Leaking of amniotic fluid. This is also known as your "water breaking." It could be a slow trickle or a gush. Let your health care provider know if it has a color or strange odor. If you have any of these signs, call your health care provider right away, even if it is before your due date. Follow these instructions at home: Medicines  Follow your health care provider's instructions regarding medicine use. Specific medicines may be either safe or unsafe to take during pregnancy.  Take a prenatal vitamin that contains at least 600 micrograms (mcg) of folic acid.  If you develop constipation, try taking a stool softener if your health care provider approves. Eating and drinking  Eat a  balanced diet that includes fresh fruits and vegetables, whole grains, good sources of protein such as meat, eggs, or tofu, and low-fat dairy. Your health care provider will help you determine the amount of weight gain that is right for you.  Avoid raw meat and uncooked cheese. These carry germs that can cause birth defects in the baby.  If you have low calcium intake from food, talk to your health care provider about whether you should take a daily calcium supplement.  Eat four or five small meals rather than three large meals a day.  Limit foods that are high in fat and processed sugars, such as fried and sweet foods.  To prevent constipation: ? Drink enough fluid to keep your urine clear or pale yellow. ? Eat foods that are high in fiber, such as fresh fruits and vegetables, whole grains, and beans. Activity  Exercise only as directed by your health care provider. Most women can continue their usual exercise routine during pregnancy. Try to exercise for 30   minutes at least 5 days a week. Stop exercising if you experience uterine contractions.  Avoid heavy lifting.  Do not exercise in extreme heat or humidity, or at high altitudes.  Wear low-heel, comfortable shoes.  Practice good posture.  You may continue to have sex unless your health care provider tells you otherwise. Relieving pain and discomfort  Take frequent breaks and rest with your legs elevated if you have leg cramps or low back pain.  Take warm sitz baths to soothe any pain or discomfort caused by hemorrhoids. Use hemorrhoid cream if your health care provider approves.  Wear a good support bra to prevent discomfort from breast tenderness.  If you develop varicose veins: ? Wear support pantyhose or compression stockings as told by your healthcare provider. ? Elevate your feet for 15 minutes, 3-4 times a day. Prenatal care  Write down your questions. Take them to your prenatal visits.  Keep all your prenatal  visits as told by your health care provider. This is important. Safety  Wear your seat belt at all times when driving.  Make a list of emergency phone numbers, including numbers for family, friends, the hospital, and police and fire departments. General instructions  Avoid cat litter boxes and soil used by cats. These carry germs that can cause birth defects in the baby. If you have a cat, ask someone to clean the litter box for you.  Do not travel far distances unless it is absolutely necessary and only with the approval of your health care provider.  Do not use hot tubs, steam rooms, or saunas.  Do not drink alcohol.  Do not use any products that contain nicotine or tobacco, such as cigarettes and e-cigarettes. If you need help quitting, ask your health care provider.  Do not use any medicinal herbs or unprescribed drugs. These chemicals affect the formation and growth of the baby.  Do not douche or use tampons or scented sanitary pads.  Do not cross your legs for long periods of time.  To prepare for the arrival of your baby: ? Take prenatal classes to understand, practice, and ask questions about labor and delivery. ? Make a trial run to the hospital. ? Visit the hospital and tour the maternity area. ? Arrange for maternity or paternity leave through employers. ? Arrange for family and friends to take care of pets while you are in the hospital. ? Purchase a rear-facing car seat and make sure you know how to install it in your car. ? Pack your hospital bag. ? Prepare the baby's nursery. Make sure to remove all pillows and stuffed animals from the baby's crib to prevent suffocation.  Visit your dentist if you have not gone during your pregnancy. Use a soft toothbrush to brush your teeth and be gentle when you floss. Contact a health care provider if:  You are unsure if you are in labor or if your water has broken.  You become dizzy.  You have mild pelvic cramps, pelvic  pressure, or nagging pain in your abdominal area.  You have lower back pain.  You have persistent nausea, vomiting, or diarrhea.  You have an unusual or bad smelling vaginal discharge.  You have pain when you urinate. Get help right away if:  Your water breaks before 37 weeks.  You have regular contractions less than 5 minutes apart before 37 weeks.  You have a fever.  You are leaking fluid from your vagina.  You have spotting or bleeding from your vagina.    You have severe abdominal pain or cramping.  You have rapid weight loss or weight gain.  You have shortness of breath with chest pain.  You notice sudden or extreme swelling of your face, hands, ankles, feet, or legs.  Your baby makes fewer than 10 movements in 2 hours.  You have severe headaches that do not go away when you take medicine.  You have vision changes. Summary  The third trimester is from week 28 through week 40, months 7 through 9. The third trimester is a time when the unborn baby (fetus) is growing rapidly.  During the third trimester, your discomfort may increase as you and your baby continue to gain weight. You may have abdominal, leg, and back pain, sleeping problems, and an increased need to urinate.  During the third trimester your breasts will keep growing and they will continue to become tender. A yellow fluid (colostrum) may leak from your breasts. This is the first milk you are producing for your baby.  False labor is a condition in which you feel small, irregular tightenings of the muscles in the womb (contractions) that eventually go away. These are called Braxton Hicks contractions. Contractions may last for hours, days, or even weeks before true labor sets in.  Signs of labor can include: abdominal cramps; regular contractions that start at 10 minutes apart and become stronger and more frequent with time; watery or bloody mucus discharge that comes from the vagina; increased pelvic pressure  and dull back pain; and leaking of amniotic fluid. This information is not intended to replace advice given to you by your health care provider. Make sure you discuss any questions you have with your health care provider. Document Released: 02/27/2001 Document Revised: 08/11/2015 Document Reviewed: 05/06/2012 Elsevier Interactive Patient Education  2017 [employer]  

## 2016-11-23 NOTE — Progress Notes (Signed)
   PRENATAL VISIT NOTE  Subjective:  Catherine Ruiz is a 26 y.o. G3P1011 at 3816w1d being seen today for ongoing prenatal care.  She is currently monitored for the following issues for this low-risk pregnancy and has Tobacco abuse; Supervision of other normal pregnancy, antepartum; Headache; and Heart palpitations on her problem list.  Patient reports no complaints.  Contractions: Not present.  .  Movement: Present. Denies leaking of fluid.   The following portions of the patient's history were reviewed and updated as appropriate: allergies, current medications, past family history, past medical history, past social history, past surgical history and problem list. Problem list updated.  Objective:   Vitals:   11/23/16 0822  BP: 117/76  Pulse: (!) 106  Weight: 165 lb 3.2 oz (74.9 kg)    Fetal Status: Fetal Heart Rate (bpm): 145 Fundal Height: 29 cm Movement: Present     General:  Alert, oriented and cooperative. Patient is in no acute distress.  Skin: Skin is warm and dry. No rash noted.   Cardiovascular: Normal heart rate noted  Respiratory: Normal respiratory effort, no problems with respiration noted  Abdomen: Soft, gravid, appropriate for gestational age.  Pain/Pressure: Present     Pelvic: Cervical exam deferred        Extremities: Normal range of motion.  Edema: None  Mental Status:  Normal mood and affect. Normal behavior. Normal judgment and thought content.   Assessment and Plan:  Pregnancy: G3P1011 at 4116w1d  1. Supervision of other normal pregnancy, antepartum Third trimester labs and Tdap today. - Glucose Tolerance, 2 Hours w/1 Hour - CBC - RPR - HIV antibody - Tdap vaccine greater than or equal to 7yo IM Preterm labor symptoms and general obstetric precautions including but not limited to vaginal bleeding, contractions, leaking of fluid and fetal movement were reviewed in detail with the patient. Please refer to After Visit Summary for other counseling  recommendations.  Return in about 2 weeks (around 12/07/2016) for OB Visit.   Jaynie CollinsUgonna Kaeden Mester, MD

## 2016-11-24 LAB — GLUCOSE TOLERANCE, 2 HOURS W/ 1HR
GLUCOSE, 1 HOUR: 107 mg/dL (ref 65–179)
Glucose, 2 hour: 94 mg/dL (ref 65–152)
Glucose, Fasting: 95 mg/dL — ABNORMAL HIGH (ref 65–91)

## 2016-11-24 LAB — CBC
Hematocrit: 35.4 % (ref 34.0–46.6)
Hemoglobin: 11.8 g/dL (ref 11.1–15.9)
MCH: 31.1 pg (ref 26.6–33.0)
MCHC: 33.3 g/dL (ref 31.5–35.7)
MCV: 93 fL (ref 79–97)
Platelets: 287 10*3/uL (ref 150–379)
RBC: 3.79 x10E6/uL (ref 3.77–5.28)
RDW: 13.7 % (ref 12.3–15.4)
WBC: 12.4 10*3/uL — AB (ref 3.4–10.8)

## 2016-11-24 LAB — HIV ANTIBODY (ROUTINE TESTING W REFLEX): HIV SCREEN 4TH GENERATION: NONREACTIVE

## 2016-11-24 LAB — RPR: RPR: NONREACTIVE

## 2016-11-25 ENCOUNTER — Encounter: Payer: Self-pay | Admitting: Obstetrics and Gynecology

## 2016-11-25 DIAGNOSIS — O24415 Gestational diabetes mellitus in pregnancy, controlled by oral hypoglycemic drugs: Secondary | ICD-10-CM | POA: Insufficient documentation

## 2016-11-26 ENCOUNTER — Telehealth: Payer: Self-pay | Admitting: *Deleted

## 2016-11-26 DIAGNOSIS — O24419 Gestational diabetes mellitus in pregnancy, unspecified control: Secondary | ICD-10-CM

## 2016-11-26 MED ORDER — ACCU-CHEK NANO SMARTVIEW W/DEVICE KIT: 1.0000 | PACK | 0 refills | Status: DC

## 2016-11-26 MED ORDER — ACCU-CHEK FASTCLIX LANCETS MISC: 1.0000 [IU] | Freq: Four times a day (QID) | 12 refills | Status: DC

## 2016-11-26 MED ORDER — GLUCOSE BLOOD VI STRP: ORAL_STRIP | 12 refills | Status: DC

## 2016-11-26 NOTE — Telephone Encounter (Signed)
-----   Message from Catalina AntiguaPeggy Constant, MD sent at 11/25/2016  8:21 PM EDT ----- Please inform patient of failed 2 hour glucola. Please refer to diabetes counseling  Peggy

## 2016-11-26 NOTE — Telephone Encounter (Signed)
Tried calling pt to go over lab results and to advise GDM supplies sent to pharmacy. Pt VM was full and unable to leave a message.  Sent supplies to pharm and referral to nutrition.

## 2016-12-05 ENCOUNTER — Ambulatory Visit (INDEPENDENT_AMBULATORY_CARE_PROVIDER_SITE_OTHER): Payer: 59 | Admitting: Obstetrics and Gynecology

## 2016-12-05 VITALS — BP 106/68 | HR 99 | Wt 171.0 lb

## 2016-12-05 DIAGNOSIS — Z3480 Encounter for supervision of other normal pregnancy, unspecified trimester: Secondary | ICD-10-CM

## 2016-12-05 DIAGNOSIS — O24419 Gestational diabetes mellitus in pregnancy, unspecified control: Secondary | ICD-10-CM

## 2016-12-05 MED ORDER — ACCU-CHEK NANO SMARTVIEW W/DEVICE KIT: 1.0000 | PACK | 0 refills | Status: DC

## 2016-12-05 MED ORDER — ACCU-CHEK FASTCLIX LANCETS MISC: 1.0000 [IU] | Freq: Four times a day (QID) | 12 refills | Status: DC

## 2016-12-05 MED ORDER — GLUCOSE BLOOD VI STRP: ORAL_STRIP | 12 refills | Status: DC

## 2016-12-05 NOTE — Progress Notes (Signed)
   PRENATAL VISIT NOTE  Subjective:  Catherine Ruiz is a 26 y.o. G3P1011 at 64w6dbeing seen today for ongoing prenatal care.  She is currently monitored for the following issues for this high-risk pregnancy and has Tobacco abuse; Supervision of other normal pregnancy, antepartum; Headache; Heart palpitations; and Gestational diabetes mellitus (GDM) affecting pregnancy on her problem list.  Patient reports no complaints.  Contractions: Not present. Vag. Bleeding: None.  Movement: Present. Denies leaking of fluid.   The following portions of the patient's history were reviewed and updated as appropriate: allergies, current medications, past family history, past medical history, past social history, past surgical history and problem list. Problem list updated.  Objective:   Vitals:   12/05/16 1512  BP: 106/68  Pulse: 99  Weight: 171 lb (77.6 kg)    Fetal Status: Fetal Heart Rate (bpm): 140 Fundal Height: 31 cm Movement: Present     General:  Alert, oriented and cooperative. Patient is in no acute distress.  Skin: Skin is warm and dry. No rash noted.   Cardiovascular: Normal heart rate noted  Respiratory: Normal respiratory effort, no problems with respiration noted  Abdomen: Soft, gravid, appropriate for gestational age.  Pain/Pressure: Present     Pelvic: Cervical exam deferred        Extremities: Normal range of motion.  Edema: None  Mental Status:  Normal mood and affect. Normal behavior. Normal judgment and thought content.   Assessment and Plan:  Pregnancy: G3P1011 at 37w6d1. Supervision of other normal pregnancy, antepartum Patient is doing well without complaints  2. Gestational diabetes mellitus (GDM) affecting pregnancy Patient informed of failed 2 hour glucola. Implication of diabetes explained to the patient. Complications related to poorly controlled GDM in pregnancy discussed including but not limited to fetal macrosomia, risks of shoulder dystocia, increased risk  for c-section, risks of IUFD or neonatal demise. Patient to meet with diabetic educator - Referral to Nutrition and Diabetes Services - ACCU-CHEK FASTCLIX LANCETS MISC; 1 Units by Percutaneous route 4 (four) times daily.  Dispense: 100 each; Refill: 12 - Blood Glucose Monitoring Suppl (ACCU-CHEK NANO SMARTVIEW) w/Device KIT; 1 kit by Subdermal route as directed. Check blood sugars for fasting, and two hours after breakfast, lunch and dinner (4 checks daily)  Dispense: 1 kit; Refill: 0 - glucose blood (ACCU-CHEK SMARTVIEW) test strip; Use as instructed to check blood sugars  Dispense: 100 each; Refill: 12  Preterm labor symptoms and general obstetric precautions including but not limited to vaginal bleeding, contractions, leaking of fluid and fetal movement were reviewed in detail with the patient. Please refer to After Visit Summary for other counseling recommendations.  Return in about 2 weeks (around 12/19/2016).   PeMora BellmanMD

## 2016-12-05 NOTE — Patient Instructions (Signed)
Gestational Diabetes Mellitus, Self Care When you have gestational diabetes (gestational diabetes mellitus), you must keep your blood sugar (glucose) under control. You can do this with:  Nutrition.  Exercise.  Lifestyle changes.  Medicines or insulin, if needed.  Support from your doctors and others.  How do I manage my blood sugar?  Check your blood sugar every day during pregnancy. Check it as often as told.  Call your doctor if your blood sugar is above your goal numbers for 2 tests in a row. Your doctor will set treatment goals for you. Try to have these blood sugars:  After not eating for a long time (after fasting): at or below 95 mg/dL (5.3 mmol/L).  After meals (postprandial): ? One hour after a meal: at or below 140 mg/dL (7.8 mmol/L). ? Two hours after a meal: at or below 120 mg/dL (6.7 mmol/L).  A1c (hemoglobin A1c) level: 6-6.5%.  What do I need to know about high blood sugar? High blood sugar is called hyperglycemia. Know the early signs of high blood sugar. Signs include:  Feeling: ? Thirsty. ? Hungry. ? Very tired.  Needing to pee (urinate) more than usual.  Blurry vision.  What do I need to know about low blood sugar? Low blood sugar is called hypoglycemia. This is when blood sugar is at or below 70 mg/dL (3.9 mmol/L). Symptoms may include:  Feeling: ? Hungry. ? Worried or nervous (anxious). ? Sweaty or clammy. ? Confused. ? Dizzy. ? Sleepy. ? Sick to your stomach (nauseous).  Having: ? A fast heartbeat. ? A headache. ? A change in vision. ? Jerky movements that you cannot control (seizure). ? Nightmares. ? Tingling or no feeling (numbness) around the mouth, lips, or tongue.  Having trouble with: ? Talking. ? Paying attention (concentrating). ? Moving (coordination). ? Sleeping.  Shaking.  Passing out (fainting).  Getting upset easily (irritability).  Treating low blood sugar  To treat low blood sugar, eat or drink something  sugary right away. If you can think clearly and swallow safely, follow the 15:15 rule:  Take 15 grams of a fast-acting carb (carbohydrate). Some fast-acting carbs are: ? 1 tube of glucose gel. ? 3 sugar tablets (glucose pills). ? 6-8 pieces of hard candy. ? 4 oz (120 mL) of fruit juice. ? 4 oz (120 mL) regular (not diet) soda.  Check your blood sugar 15 minutes after you take the carb.  If your blood sugar is still at or below 70 mg/dL (3.9 mmol/L), take 15 grams of a carb again.  If your blood sugar does not go above 70 mg/dL (3.9 mmol/L) after 3 tries, get help right away.  After your blood sugar goes back to normal, eat a meal or a snack within 1 hour.  Treating very low blood sugar If your blood sugar is at or below 54 mg/dL (3 mmol/L), you have very low blood sugar (severe hypoglycemia). This is an emergency. Do not wait to see if the symptoms will go away. Get medical help right away. Call your local emergency services (911 in the [place] Do not drive yourself to the hospital. If you have very low blood sugar and you cannot eat or drink, you may need a glucagon shot (injection). A family member or friend should learn:  How to check your blood sugar.  How to give you a glucagon shot.  Ask your doctor if you need a glucagon shot kit at home. What else is important to manage my diabetes? Medicine  Take your insulin and diabetes medicines as told.  Do not run out of insulin or medicines.  Adjust your insulin and diabetes medicines as told. Food   Make healthy food choices. These include: ? Chicken, fish, egg whites, and beans. ? Oats, whole wheat, bulgur, brown rice, quinoa, and millet. ? Fresh fruits and vegetables. ? Low-fat dairy products. ? Nuts, avocado, olive oil, and canola oil.  Make an eating plan. A food specialist (dietitian) can help you.  Follow instructions from your doctor about what you cannot eat or drink.  Drink enough fluid to keep your pee (urine)  clear or pale yellow.  Eat healthy snacks between healthy meals.  Keep track of carbs you eat. Read food labels. Learn about food serving sizes.  Follow your sick day plan when you cannot eat or drink normally. Make this plan with your doctor. Activity  Exercise 30 minutes or more a day or as much as told by your doctor.  Talk with your doctor if you start a new exercise. Your doctor may need to adjust your insulin, medicines, or food. Lifestyle  Do not drink alcohol.  Do not use any tobacco products. If you need help quitting, ask your doctor.  Learn how to deal with stress. If you need help with this, ask your doctor. Body care   Stay up to date with your shots (immunizations).  Brush your teeth and gums two times a day. Floss at least one time a day.  Go to the dentist least one time every 6 months.  Stay at a healthy weight while you are pregnant. General instructions  Take over-the-counter and prescription medicines only as told by your doctor.  Ask your doctor about risks of high blood pressure in pregnancy. These are called preeclampsia and eclampsia.  Share your diabetes care plan with: ? Your work or school. ? People you live with.  Check your pee for ketones: ? When you are sick. ? As told by your doctor.  Ask your doctor: ? Do I need to meet with a diabetes educator? ? Where can I find a support group for people with diabetes?  Carry a card or wear jewelry that says that you have diabetes.  Keep all follow-up visits with your doctor. This is important. Care after giving birth   Have your blood sugar checked 4-12 weeks after you give birth.  Get checked for diabetes at least every 3 years. Where to find more information: To learn more about diabetes, visit:  American Diabetes Association: www.diabetes.org/diabetes-basics/gestational  Centers for Disease Control and Prevention (CDC): http://sanchez-watson.com/.pdf  This  information is not intended to replace advice given to you by your health care provider. Make sure you discuss any questions you have with your health care provider. Document Released: 06/27/2015 Document Revised: 08/11/2015 Document Reviewed: 04/08/2015 Elsevier Interactive Patient Education  [employer].

## 2016-12-06 ENCOUNTER — Other Ambulatory Visit: Payer: Self-pay | Admitting: Obstetrics & Gynecology

## 2016-12-06 DIAGNOSIS — O24419 Gestational diabetes mellitus in pregnancy, unspecified control: Secondary | ICD-10-CM

## 2016-12-06 MED ORDER — ACCU-CHEK NANO SMARTVIEW W/DEVICE KIT: 1.0000 | PACK | 0 refills | Status: DC

## 2016-12-06 MED ORDER — ACCU-CHEK FASTCLIX LANCETS MISC: 1.0000 [IU] | Freq: Four times a day (QID) | 12 refills | Status: DC

## 2016-12-06 NOTE — Addendum Note (Signed)
Addended by: Arne Cleveland on: 12/06/2016 03:28 PM   Modules accepted: Orders

## 2016-12-06 NOTE — Addendum Note (Signed)
Addended by: Arne Cleveland on: 12/06/2016 09:44 AM   Modules accepted: Orders

## 2016-12-12 ENCOUNTER — Encounter: Payer: 59 | Attending: Obstetrics and Gynecology | Admitting: Registered"

## 2016-12-12 DIAGNOSIS — Z3A00 Weeks of gestation of pregnancy not specified: Secondary | ICD-10-CM | POA: Diagnosis not present

## 2016-12-12 DIAGNOSIS — O24419 Gestational diabetes mellitus in pregnancy, unspecified control: Secondary | ICD-10-CM | POA: Diagnosis not present

## 2016-12-12 DIAGNOSIS — R7309 Other abnormal glucose: Secondary | ICD-10-CM

## 2016-12-13 ENCOUNTER — Encounter: Payer: Self-pay | Admitting: Registered"

## 2016-12-13 DIAGNOSIS — R7309 Other abnormal glucose: Secondary | ICD-10-CM | POA: Insufficient documentation

## 2016-12-13 NOTE — Progress Notes (Signed)
Patient was seen on 12/12/2016 for Gestational Diabetes self-management class at the [facility]. The following learning objectives were met by the patient during this course:   States the definition of Gestational Diabetes  States why dietary management is important in controlling blood glucose  Describes the effects each nutrient has on blood glucose levels  Demonstrates ability to create a balanced meal plan  Demonstrates carbohydrate counting   States when to check blood glucose levels  Demonstrates proper blood glucose monitoring techniques  States the effect of stress and exercise on blood glucose levels  States the importance of limiting caffeine and abstaining from alcohol and smoking  Blood glucose monitor given: n/a Lot # n/a Exp: n/a Blood glucose reading: n/a  Patient instructed to monitor glucose levels: FBS: 60 - <95 1 hour: <140 2 hour: <120  Patient received handouts:  Nutrition Diabetes and Pregnancy  Carbohydrate Counting List  Patient will be seen for follow-up as needed.

## 2016-12-17 ENCOUNTER — Telehealth: Payer: Self-pay | Admitting: *Deleted

## 2016-12-17 NOTE — Telephone Encounter (Signed)
Called pt and left message stating that we have received a fax from her pharmacy requesting refill of glucose test strips but they are a different kind than has been prescribed recently.  Please call back if you are in need of any glucose testing supplies.

## 2016-12-19 ENCOUNTER — Ambulatory Visit (INDEPENDENT_AMBULATORY_CARE_PROVIDER_SITE_OTHER): Payer: 59 | Admitting: Family Medicine

## 2016-12-19 VITALS — BP 111/76 | HR 102 | Wt 172.2 lb

## 2016-12-19 DIAGNOSIS — O99333 Smoking (tobacco) complicating pregnancy, third trimester: Secondary | ICD-10-CM | POA: Diagnosis not present

## 2016-12-19 DIAGNOSIS — F17210 Nicotine dependence, cigarettes, uncomplicated: Secondary | ICD-10-CM | POA: Diagnosis not present

## 2016-12-19 DIAGNOSIS — O24419 Gestational diabetes mellitus in pregnancy, unspecified control: Secondary | ICD-10-CM

## 2016-12-19 DIAGNOSIS — Z3480 Encounter for supervision of other normal pregnancy, unspecified trimester: Secondary | ICD-10-CM

## 2016-12-19 DIAGNOSIS — O99330 Smoking (tobacco) complicating pregnancy, unspecified trimester: Secondary | ICD-10-CM | POA: Insufficient documentation

## 2016-12-19 MED ORDER — GLUCOSE BLOOD VI STRP: ORAL_STRIP | 12 refills | Status: DC

## 2016-12-19 MED ORDER — FREESTYLE LANCETS MISC: 12 refills | Status: DC

## 2016-12-19 NOTE — Progress Notes (Signed)
   PRENATAL VISIT NOTE  Subjective:  Catherine Ruiz is a 26 y.o. G3P1011 at [redacted]w[redacted]d being seen today for ongoing prenatal care.  She is currently monitored for the following issues for this low-risk pregnancy and has Tobacco abuse; Supervision of other normal pregnancy, antepartum; Headache; Heart palpitations; Gestational diabetes mellitus (GDM) affecting pregnancy; Impaired glucose tolerance test; and Tobacco use affecting pregnancy, antepartum on her problem list.  Patient reports no complaints.  Contractions: Not present. Vag. Bleeding: None.  Movement: Present. Denies leaking of fluid.   The following portions of the patient's history were reviewed and updated as appropriate: allergies, current medications, past family history, past medical history, past social history, past surgical history and problem list. Problem list updated.  Objective:   Vitals:   12/19/16 1533  BP: 111/76  Pulse: (!) 102  Weight: 172 lb 3.2 oz (78.1 kg)    Fetal Status: Fetal Heart Rate (bpm): 134 Fundal Height: 32 cm Movement: Present     General:  Alert, oriented and cooperative. Patient is in no acute distress.  Skin: Skin is warm and dry. No rash noted.   Cardiovascular: Normal heart rate noted  Respiratory: Normal respiratory effort, no problems with respiration noted  Abdomen: Soft, gravid, appropriate for gestational age.  Pain/Pressure: Present     Pelvic: Cervical exam deferredxcZ        Extremities: Normal range of motion.  Edema: None  Mental Status:  Normal mood and affect. Normal behavior. Normal judgment and thought content.    Fasting 69-122  (3 out of 7 above 95) 2hr PP 85-98   Assessment and Plan:  Pregnancy: G3P1011 at [redacted]w[redacted]d  1. Supervision of other normal pregnancy, antepartum UTD  2. Gestational diabetes mellitus (GDM) affecting pregnancy 50% above goal for fasting, pp are well within normal Discussed changing diet and adding exercise to help control sugars Reviewed  nutritional recommendations  3. Tobacco use affecting pregnancy, antepartum Smokes 1/2 pack per day. Used motivational interviewing to discuss barriers to tobacco cessation. Spent 3-10 minutes in direct counseling about ways to reduce use with the goal of eventual cessation. Reviewed health concerns for pregnancy and infant related to tobacco use.  Discussed gradual reduction and replacement of cigs with other activities.    Preterm labor symptoms and general obstetric precautions including but not limited to vaginal bleeding, contractions, leaking of fluid and fetal movement were reviewed in detail with the patient. Please refer to After Visit Summary for other counseling recommendations.   Return in about 1 week (around 12/26/2016) for Routine prenatal care.   Federico Flake, MD

## 2016-12-26 ENCOUNTER — Ambulatory Visit (INDEPENDENT_AMBULATORY_CARE_PROVIDER_SITE_OTHER): Payer: 59 | Admitting: Student

## 2016-12-26 VITALS — BP 108/76 | HR 88 | Wt 174.0 lb

## 2016-12-26 DIAGNOSIS — R300 Dysuria: Secondary | ICD-10-CM | POA: Diagnosis not present

## 2016-12-26 DIAGNOSIS — O24419 Gestational diabetes mellitus in pregnancy, unspecified control: Secondary | ICD-10-CM | POA: Diagnosis not present

## 2016-12-26 DIAGNOSIS — O24415 Gestational diabetes mellitus in pregnancy, controlled by oral hypoglycemic drugs: Secondary | ICD-10-CM

## 2016-12-26 DIAGNOSIS — Z3480 Encounter for supervision of other normal pregnancy, unspecified trimester: Secondary | ICD-10-CM

## 2016-12-26 LAB — OB RESULTS CONSOLE GBS: STREP GROUP B AG: POSITIVE

## 2016-12-26 MED ORDER — METFORMIN HCL 500 MG PO TABS: 500.0000 mg | ORAL_TABLET | Freq: Every day | ORAL | 2 refills | Status: DC

## 2016-12-26 NOTE — Patient Instructions (Signed)
Gestational Diabetes Mellitus, Diagnosis Gestational diabetes (gestational diabetes mellitus) is a temporary form of diabetes that some women develop during pregnancy. It usually occurs around weeks 24-28 of pregnancy and goes away after delivery. Hormonal changes during pregnancy can interfere with insulin production and function, which may result in one or both of these problems:  The pancreas does not make enough of a hormone called insulin.  Cells in the body do not respond properly to insulin that the body makes (insulin resistance).  Normally, insulin allows sugars (glucose) to enter cells in the body. The cells use glucose for energy. Insulin resistance or lack of insulin causes excess glucose to build up in the blood instead of going into cells. As a result, high blood glucose (hyperglycemia) develops. What are the risks? If gestational diabetes is treated, it is unlikely to cause problems. If it is not controlled with treatment, it may cause problems during labor and delivery, and some of those problems can be harmful to the unborn baby (fetus) and the mother. Uncontrolled gestational diabetes may also cause the newborn baby to have breathing problems and low blood glucose. Women who get gestational diabetes are more likely to develop it if they get pregnant again, and they are more likely to develop type 2 diabetes in the future. What increases the risk? This condition may be more likely to develop in pregnant women who:  Are older than age 25 during pregnancy.  Have a family history of diabetes.  Are overweight.  Had gestational diabetes in the past.  Have polycystic ovarian syndrome (PCOS).  Are pregnant with twins or multiples.  Are of American-Indian, African-American, Hispanic/Latino, or Asian/Pacific Islander descent.  What are the signs or symptoms? Most women do not notice symptoms of gestational diabetes because the symptoms are similar to normal symptoms of pregnancy.  Symptoms of gestational diabetes may include:  Increased thirst (polydipsia).  Increased hunger(polyphagia).  Increased urination (polyuria).  How is this diagnosed?  This condition may be diagnosed based on your blood glucose level, which may be checked with one or more of the following blood tests:  A fasting blood glucose (FBG) test. You will not be allowed to eat (you will fast) for at least 8 hours before a blood sample is taken.  A random blood glucose test. This checks your blood glucose at any time of day regardless of when you ate.  An oral glucose tolerance test (OGTT). This is usually done during weeks 24-28 of pregnancy. ? For this test, you will have an FBG test done. Then, you will drink a beverage that contains glucose. Your blood glucose will be tested again 1 hour after drinking the glucose beverage (1-hour OGTT). ? If the 1-hour OGTT result is at or above 140 mg/dL (7.8 mmol/L), you will repeat the OGTT. This time, your blood glucose will be tested 3 hours after drinking the glucose beverage (3-hour OGTT).  If you have risk factors, you may be screened for undiagnosed type 2 diabetes at your first health care visit during your pregnancy (prenatal visit). How is this treated?  Your treatment may be managed by a specialist called an endocrinologist. This condition is treated by following instructions from your health care provider about:  Eating a healthier diet and getting more physical activity. These changes are the most important ways to manage gestational diabetes.  Checking your blood glucose. Do this as often as told.  Taking diabetes medicines or insulin every day. These will only be prescribed if they are   needed. ? If you use insulin, you may need to adjust your dosage based on how physically active you are and what foods you eat. Your health care provider will tell you how to do this.  Your health care provider will set treatment goals for you based on the  stage of your pregnancy and any other medical conditions you have. Generally, the goal of treatment is to maintain the following blood glucose levels during pregnancy:  Fasting: at or below 95 mg/dL (5.3 mmol/L).  After meals (postprandial): ? One hour after a meal: at or below 140 mg/dL (7.8 mmol/L). ? Two hours after a meal: at or below 120 mg/dL (6.7 mmol/L).  A1c (hemoglobin A1c) level: 6-6.5%.  Follow these instructions at home:  Take over-the-counter and prescription medicines only as told by your health care provider.  Manage your weight gain during pregnancy. The amount of weight that you are expected to gain depends on your pre-pregnancy BMI (body mass index).  Keep all follow-up visits as told by your health care provider. This is important. Consider asking your health care provider these questions:   Do I need to meet with a diabetes educator?  Where can I find a support group for people with diabetes?  What equipment will I need to manage my diabetes at home?  What diabetes medicines do I need, and when should I take them?  How often do I need to check my blood glucose?  What number can I call if I have questions?  When is my next appointment? Where to find more information:  For more information about diabetes, visit: ? American Diabetes Association (ADA): www.diabetes.org ? American Association of Diabetes Educators (AADE): www.diabeteseducator.org/patient-resources Contact a health care provider if:  Your blood glucose level is at or above 240 mg/dL (13.3 mmol/L).  Your blood glucose level is at or above 200 mg/dL (11.1 mmol/L) and you have ketones in your urine.  You have been sick or have had a fever for 2 days or more and you are not getting better.  You have any of the following problems for more than 6 hours: ? You cannot eat or drink. ? You have nausea and vomiting. ? You have diarrhea. Get help right away if:  Your blood glucose is below 54  mg/dL (3 mmol/L).  You become confused or you have trouble thinking clearly.  You have difficulty breathing.  You have moderate or large ketone levels in your urine.  Your baby is moving around less than usual.  You develop unusual discharge or bleeding from your vagina.  You start having contractions early (prematurely). Contractions may feel like a tightening in your lower abdomen. This information is not intended to replace advice given to you by your health care provider. Make sure you discuss any questions you have with your health care provider. Document Released: 06/11/2000 Document Revised: 08/11/2015 Document Reviewed: 04/08/2015 Elsevier Interactive Patient Education  2017 Elsevier Inc.  

## 2016-12-27 LAB — COMPREHENSIVE METABOLIC PANEL
A/G RATIO: 1.3 (ref 1.2–2.2)
ALK PHOS: 157 IU/L — AB (ref 39–117)
ALT: 4 IU/L (ref 0–32)
AST: 13 IU/L (ref 0–40)
Albumin: 3.5 g/dL (ref 3.5–5.5)
BUN/Creatinine Ratio: 9 (ref 9–23)
BUN: 6 mg/dL (ref 6–20)
Bilirubin Total: <0.2 mg/dL (ref 0.0–1.2)
CO2: 19 mmol/L — ABNORMAL LOW (ref 20–29)
Calcium: 9.3 mg/dL (ref 8.7–10.2)
Chloride: 105 mmol/L (ref 96–106)
Creatinine, Ser: 0.69 mg/dL (ref 0.57–1.00)
GFR calc Af Amer: 140 mL/min/{1.73_m2} (ref >59)
GFR calc non Af Amer: 121 mL/min/{1.73_m2} (ref >59)
GLOBULIN, TOTAL: 2.6 g/dL (ref 1.5–4.5)
Glucose: 93 mg/dL (ref 65–99)
POTASSIUM: 4 mmol/L (ref 3.5–5.2)
SODIUM: 139 mmol/L (ref 134–144)
Total Protein: 6.1 g/dL (ref 6.0–8.5)

## 2016-12-27 NOTE — Progress Notes (Signed)
   PRENATAL VISIT NOTE  Subjective:  Catherine Ruiz is a 26 y.o. G3P1011 at [redacted]w[redacted]d being seen today for ongoing prenatal care.  She is currently monitored for the following issues for this low-risk pregnancy and has Tobacco abuse; Supervision of other normal pregnancy, antepartum; Headache; Heart palpitations; Gestational diabetes mellitus (GDM) in third trimester controlled on oral hypoglycemic drug; Impaired glucose tolerance test; and Tobacco use affecting pregnancy, antepartum on her problem list.  Patient reports no complaints and occasional dysuria. . Denies lower back pain or contractions. .  Movement: Present. Denies leaking of fluid.   The following portions of the patient's history were reviewed and updated as appropriate: allergies, current medications, past family history, past medical history, past social history, past surgical history and problem list. Problem list updated.  Objective:   Vitals:   12/26/16 1555  BP: 108/76  Pulse: 88  Weight: 174 lb (78.9 kg)    Fetal Status: Fetal Heart Rate (bpm): 135 Fundal Height: 33 cm Movement: Present     General:  Alert, oriented and cooperative. Patient is in no acute distress.  Skin: Skin is warm and dry. No rash noted.   Cardiovascular: Normal heart rate noted  Respiratory: Normal respiratory effort, no problems with respiration noted  Abdomen: Soft, gravid, appropriate for gestational age.        Pelvic: Cervical exam deferred        Extremities: Normal range of motion.  Edema: None  Mental Status:  Normal mood and affect. Normal behavior. Normal judgment and thought content.   Assessment and Plan:  Pregnancy: G3P1011 at [redacted]w[redacted]d  1. Dysuria - UA normal in office - Culture, OB Urine - Comprehensive metabolic panel  2. Gestational diabetes mellitus (GDM), antepartum, gestational diabetes method of control unspecified  - US Fetal BPP W/O Non Stress; Future - Comprehensive metabolic panel -NST today in office today.  Baseline 125 bpm; mod var, present acel, neg decels. No Contractions. Fetal tracing documented on paper; patient's OBIX not done.  3. Gestational diabetes mellitus (GDM) in third trimester controlled on oral hypoglycemic drug Patient's has 4 elevated fasting blood sugars in the past 7 days; 107, 99, 108 and 136. Prior week also showed elevated fastings. Per Dr. Vergie Living, patient to be started on Metformin 500 mg with dinner and begin antenatal testing. PP are well controlled. All questions answered.   4. Supervision of other normal pregnancy, antepartum   Preterm labor symptoms and general obstetric precautions including but not limited to vaginal bleeding, contractions, leaking of fluid and fetal movement were reviewed in detail with the patient. Please refer to After Visit Summary for other counseling recommendations.  Return in about 2 weeks (around 01/09/2017). for prenatal visit, and this Friday for NST/AFI. Will also return next week for NST/AFI.    [name], CNM

## 2016-12-28 ENCOUNTER — Ambulatory Visit (INDEPENDENT_AMBULATORY_CARE_PROVIDER_SITE_OTHER): Payer: 59 | Admitting: *Deleted

## 2016-12-28 DIAGNOSIS — O24415 Gestational diabetes mellitus in pregnancy, controlled by oral hypoglycemic drugs: Secondary | ICD-10-CM | POA: Diagnosis not present

## 2016-12-28 MED ORDER — METFORMIN HCL 500 MG PO TABS: 500.0000 mg | ORAL_TABLET | Freq: Every day | ORAL | 2 refills | Status: DC

## 2016-12-28 NOTE — Progress Notes (Signed)
12/28/2016 NST reviewed and reactive with baseline 130, mod variability, +accels, no decels

## 2016-12-31 ENCOUNTER — Encounter: Payer: Self-pay | Admitting: *Deleted

## 2016-12-31 ENCOUNTER — Inpatient Hospital Stay (HOSPITAL_COMMUNITY)
Admission: AD | Admit: 2016-12-31 | Discharge: 2016-12-31 | Disposition: A | Discharge status: Home / Self Care | Payer: 59 | Source: Ambulatory Visit | Attending: Family Medicine | Admitting: Family Medicine

## 2016-12-31 ENCOUNTER — Encounter (HOSPITAL_COMMUNITY): Payer: Self-pay

## 2016-12-31 DIAGNOSIS — Z3A34 34 weeks gestation of pregnancy: Secondary | ICD-10-CM | POA: Insufficient documentation

## 2016-12-31 DIAGNOSIS — R0602 Shortness of breath: Secondary | ICD-10-CM | POA: Diagnosis present

## 2016-12-31 DIAGNOSIS — F17210 Nicotine dependence, cigarettes, uncomplicated: Secondary | ICD-10-CM | POA: Diagnosis not present

## 2016-12-31 DIAGNOSIS — O24415 Gestational diabetes mellitus in pregnancy, controlled by oral hypoglycemic drugs: Secondary | ICD-10-CM | POA: Insufficient documentation

## 2016-12-31 DIAGNOSIS — O26893 Other specified pregnancy related conditions, third trimester: Secondary | ICD-10-CM | POA: Diagnosis not present

## 2016-12-31 DIAGNOSIS — K219 Gastro-esophageal reflux disease without esophagitis: Secondary | ICD-10-CM | POA: Insufficient documentation

## 2016-12-31 DIAGNOSIS — O99333 Smoking (tobacco) complicating pregnancy, third trimester: Secondary | ICD-10-CM | POA: Diagnosis not present

## 2016-12-31 DIAGNOSIS — O99613 Diseases of the digestive system complicating pregnancy, third trimester: Secondary | ICD-10-CM | POA: Diagnosis not present

## 2016-12-31 DIAGNOSIS — O99619 Diseases of the digestive system complicating pregnancy, unspecified trimester: Secondary | ICD-10-CM

## 2016-12-31 DIAGNOSIS — R0789 Other chest pain: Secondary | ICD-10-CM | POA: Insufficient documentation

## 2016-12-31 HISTORY — DX: Gestational diabetes mellitus in pregnancy, unspecified control: O24.419

## 2016-12-31 LAB — CBC WITH DIFFERENTIAL/PLATELET
BASOS ABS: 0 10*3/uL (ref 0.0–0.1)
Basophils Relative: 0 %
EOS PCT: 1 %
Eosinophils Absolute: 0.1 10*3/uL (ref 0.0–0.7)
HCT: 32.8 % — ABNORMAL LOW (ref 36.0–46.0)
Hemoglobin: 10.9 g/dL — ABNORMAL LOW (ref 12.0–15.0)
LYMPHS ABS: 2.6 10*3/uL (ref 0.7–4.0)
LYMPHS PCT: 20 %
MCH: 30.6 pg (ref 26.0–34.0)
MCHC: 33.2 g/dL (ref 30.0–36.0)
MCV: 92.1 fL (ref 78.0–100.0)
MONO ABS: 0.4 10*3/uL (ref 0.1–1.0)
MONOS PCT: 3 %
Neutro Abs: 10.1 10*3/uL — ABNORMAL HIGH (ref 1.7–7.7)
Neutrophils Relative %: 76 %
PLATELETS: 357 10*3/uL (ref 150–400)
RBC: 3.56 MIL/uL — ABNORMAL LOW (ref 3.87–5.11)
RDW: 13.6 % (ref 11.5–15.5)
WBC: 13.3 10*3/uL — ABNORMAL HIGH (ref 4.0–10.5)

## 2016-12-31 LAB — URINALYSIS, ROUTINE W REFLEX MICROSCOPIC
BILIRUBIN URINE: NEGATIVE
Glucose, UA: NEGATIVE mg/dL
HGB URINE DIPSTICK: NEGATIVE
Ketones, ur: NEGATIVE mg/dL
NITRITE: NEGATIVE
PH: 6 (ref 5.0–8.0)
PROTEIN: NEGATIVE mg/dL
SPECIFIC GRAVITY, URINE: 1.009 (ref 1.005–1.030)

## 2016-12-31 LAB — TROPONIN I

## 2016-12-31 LAB — COMPREHENSIVE METABOLIC PANEL
ALT: 7 U/L — ABNORMAL LOW (ref 14–54)
ANION GAP: 9 (ref 5–15)
AST: 17 U/L (ref 15–41)
Albumin: 3.2 g/dL — ABNORMAL LOW (ref 3.5–5.0)
Alkaline Phosphatase: 175 U/L — ABNORMAL HIGH (ref 38–126)
BILIRUBIN TOTAL: 0.2 mg/dL — AB (ref 0.3–1.2)
BUN: 9 mg/dL (ref 6–20)
CHLORIDE: 103 mmol/L (ref 101–111)
CO2: 22 mmol/L (ref 22–32)
Calcium: 9.1 mg/dL (ref 8.9–10.3)
Creatinine, Ser: 0.56 mg/dL (ref 0.44–1.00)
Glucose, Bld: 86 mg/dL (ref 65–99)
POTASSIUM: 3.9 mmol/L (ref 3.5–5.1)
Sodium: 134 mmol/L — ABNORMAL LOW (ref 135–145)
TOTAL PROTEIN: 6.7 g/dL (ref 6.5–8.1)

## 2016-12-31 LAB — CULTURE, OB URINE

## 2016-12-31 LAB — URINE CULTURE, OB REFLEX

## 2016-12-31 NOTE — MAU Note (Signed)
Was at work and started having Left sided chest pain; intermittent in nature; feels sharp and pounding; rating pain 6-7/10  Reports shortness of breath with exertion (talking and walking)  States she started metformin last night and has only taken one dose.   Called her OB and they told her to come in.

## 2016-12-31 NOTE — MAU Provider Note (Signed)
History     CSN: [id number] Arrival date and time: 12/31/16 3329  First Provider Initiated Contact with Patient 12/31/16 1047      Chief Complaint  Patient presents with  . Chest Pain  . Shortness of Breath    HPI: Catherine Ruiz is a 26 y.o. [phone] with IUP at 57w4dwho presents to maternity admissions reporting intermittent chest pain starting at 7 am today. Lasting a few seconds intermittently. Some SOB while talking. Chest pain was on left side of her chest, non-radiating. Denies other associated symptoms, including no diaphoresis, palpitations, lightheadedness or dizziness. Onset of pain was while she was sitting down at work, about 30 minutes after she ate breakfast. She reports significant history of reflux, and reports taking Zantac daily for this. Did not try taking anything for the pain today, but eventually resolved on its own. Denies any current chest pain, SOB or any other symptoms. Also denies any recent fever, URI symptoms, nausea, vomiting, diarrhea, urinary symptoms, or any anxiety.   Denies contractions, leakage of fluid or vaginal bleeding, and reports ood fetal movement.   [facility] Pregnancy complicated by tobacco use, and GDM. She reports her CBG has been at goal. She was recently started on metformin and took first dose yesterday. Reports this morning's CBG was in 86.   Past obstetric history: OB History  Gravida Para Term Preterm AB Living  3 1 1   1 1   SAB TAB Ectopic Multiple Live Births  1     0 1    # Outcome Date GA Lbr Len/2nd Weight Sex Delivery Anes PTL Lv  3 Current           2 Term 07/18/14 [id number]:25 / 01:27 7 lb 13 oz (3.545 kg) F Vag-Vacuum EPI  LIV  1 SAB  7w58w0d          Past Medical History:  Diagnosis Date  . GERD (gastroesophageal reflux disease)   . Gestational diabetes   . Miscarriage   . PONV (postoperative nausea and vomiting)   . Pregnancy   . Ruptured spleen   . UTI (lower urinary tract  infection)     Past Surgical History:  Procedure Laterality Date  . APPENDECTOMY  2001  . CHOLECYSTECTOMY N/A 10/12/2014   Procedure: LAPAROSCOPIC CHOLECYSTECTOMY WITH INTRAOPERATIVE CHOLANGIOGRAM;  Surgeon: JamJudeth HornD;  [facility] [place]  Laterality: N/A;  . MOUTH SURGERY      Family History  Problem Relation Age of Onset  . Asthma Sister   . Asthma Brother     Social History  Substance Use Topics  . Smoking status: Current Every Day Smoker    Packs/day: 0.50    Years: 3.00    Types: Cigarettes  . Smokeless tobacco: Never Used     Comment: cutting back/trying to quit  . Alcohol use No    Allergies:  Allergies  Allergen Reactions  . Aspirin Nausea Only  . Keflex [Cephalexin] Nausea And Vomiting  . Sulfa Antibiotics Rash    Joints "lock up"    Prescriptions Prior to Admission  Medication Sig Dispense Refill Last Dose  . Blood Glucose Monitoring Suppl (ACCU-CHEK NANO SMARTVIEW) w/Device KIT   0   . butalbital-acetaminophen-caffeine (FIORICET, ESGIC) 50-325-40 MG tablet Take 1-2 tablets by mouth every 6 (six) hours as needed for headache. (Patient not taking: Reported on 12/26/2016) 20 tablet 0 Not Taking  . glucose blood test strip  Use as instructed 100 each 12 Taking  . Lancets (FREESTYLE) lancets Use as instructed 100 each 12 Taking  . metFORMIN (GLUCOPHAGE) 500 MG tablet Take 1 tablet (500 mg total) by mouth daily. Take with dinner 60 tablet 2   . Prenatal Vit-Fe Fumarate-FA (MULTIVITAMIN-PRENATAL) 27-0.8 MG TABS tablet Take 1 tablet by mouth daily at 12 noon.   Taking  . ranitidine (ZANTAC) 150 MG tablet Take 150 mg by mouth as needed for heartburn.   Taking    Review of Systems - Negative except for what is mentioned in HPI.  Physical Exam   Blood pressure 105/66, pulse 95, temperature 98.2 F (36.8 C), temperature source Oral, resp. rate 17, weight 173 lb 0.6 oz (78.5 kg), last menstrual period 05/03/2016, SpO2 100 %.  Constitutional:  Well-developed, well-nourished female in no acute distress.  HENT: [place]/AT, normal oropharynx mucosa. MMM Eyes: normal conjunctivae, no scleral icterus Cardiovascular: normal rate, regular rhythm, no murmur, rubs, or gallops Respiratory: normal effort, lungs CTAB, no wheezes, rales or ronchi GI: Abd soft, non-tender, gravid, appropriate for gestational age.   MSK: Extremities nontender, no edema, normal ROM; chest wall nontender to palpation  Neurologic: Alert and oriented x 4. CN II-XII intact. Moving all 4 extremities appropriately Psych: Normal mood and affect Skin: warm and dry    FHT:  Baseline 130 , moderate variability, accelerations present, no decelerations Toco: none  MAU Course  Procedures  MDM Patient seen and evaluated for chest pain. VS reviewed, wnl. Not currently having chest pain. Onset after breakfast and pt with strong hx of reflux. Pain is not reproducible on exam. Ordered: EKG, CBC, CMP, tropnin EKG NSR w/o ST segment or T wave changes, and normal QTc.  CBC and CMP wnl. Troponin I > 3 hours after onset of chest pain is negative. No additional chest pain while being evaluated here. Dicussed with patient ddx and evaluation. GI etiology most likely, but discussed precaution and need for additional evaluation if recurrent episodes of pain.  Reactive NST  Assessment and Plan  Assessment: 1. Atypical chest pain   2. Gastroesophageal reflux in pregnancy     Plan: --Discussed step up treatment for GER, scheduling her ranitidine for now plus TUMS prn. If persistent symptoms would step up therapy. --Dicussed precautions with regards to chest pain, and when to return to MAU or an emergency room. Would consider further evaluation and referral if persistent symptoms.  --Discharge home in stable condition.     Degele, Jenne Pane, MD 12/31/2016 11:03 AM

## 2016-12-31 NOTE — Discharge Instructions (Signed)
--  Take your Zantac (ranitide) daily before breakfast --If you have heartburn, or similar chest pain, take 2 Tums with cold water. If symptoms unrelieved, or you have SOB or other new concerning symptoms, return to MAU for re-evaluation.    Food Choices for Gastroesophageal Reflux Disease, Adult When you have gastroesophageal reflux disease (GERD), the foods you eat and your eating habits are very important. Choosing the right foods can help ease your discomfort. What guidelines do I need to follow?  Choose fruits, vegetables, whole grains, and low-fat dairy products.  Choose low-fat meat, fish, and poultry.  Limit fats such as oils, salad dressings, butter, nuts, and avocado.  Keep a food diary. This helps you identify foods that cause symptoms.  Avoid foods that cause symptoms. These may be different for everyone.  Eat small meals often instead of 3 large meals a day.  Eat your meals slowly, in a place where you are relaxed.  Limit fried foods.  Cook foods using methods other than frying.  Avoid drinking alcohol.  Avoid drinking large amounts of liquids with your meals.  Avoid bending over or lying down until 2-3 hours after eating. What foods are not recommended? These are some foods and drinks that may make your symptoms worse: Vegetables Tomatoes. Tomato juice. Tomato and spaghetti sauce. Chili peppers. Onion and garlic. Horseradish. Fruits Oranges, grapefruit, and lemon (fruit and juice). Meats High-fat meats, fish, and poultry. This includes hot dogs, ribs, ham, sausage, salami, and bacon. Dairy Whole milk and chocolate milk. Sour cream. Cream. Butter. Ice cream. Cream cheese. Drinks Coffee and tea. Bubbly (carbonated) drinks or energy drinks. Condiments Hot sauce. Barbecue sauce. Sweets/Desserts Chocolate and cocoa. Donuts. Peppermint and spearmint. Fats and Oils High-fat foods. This includes [place] fries and potato chips. Other Vinegar. Strong spices. This  includes black pepper, white pepper, red pepper, cayenne, curry powder, cloves, ginger, and chili powder. The items listed above may not be a complete list of foods and drinks to avoid. Contact your dietitian for more information. This information is not intended to replace advice given to you by your health care provider. Make sure you discuss any questions you have with your health care provider. Document Released: 09/04/2011 Document Revised: 08/11/2015 Document Reviewed: 01/07/2013 Elsevier Interactive Patient Education  2017 [employer].

## 2017-01-01 ENCOUNTER — Ambulatory Visit (INDEPENDENT_AMBULATORY_CARE_PROVIDER_SITE_OTHER): Payer: 59 | Admitting: *Deleted

## 2017-01-01 ENCOUNTER — Other Ambulatory Visit: Payer: Self-pay | Admitting: Student

## 2017-01-01 VITALS — Wt 172.0 lb

## 2017-01-01 DIAGNOSIS — O24415 Gestational diabetes mellitus in pregnancy, controlled by oral hypoglycemic drugs: Secondary | ICD-10-CM | POA: Diagnosis not present

## 2017-01-01 DIAGNOSIS — R8271 Bacteriuria: Secondary | ICD-10-CM | POA: Insufficient documentation

## 2017-01-01 MED ORDER — CLINDAMYCIN HCL 300 MG PO CAPS: 300.0000 mg | ORAL_CAPSULE | Freq: Three times a day (TID) | ORAL | 0 refills | Status: DC

## 2017-01-01 MED ORDER — NITROFURANTOIN MONOHYD MACRO 100 MG PO CAPS: 100.0000 mg | ORAL_CAPSULE | Freq: Two times a day (BID) | ORAL | 0 refills | Status: DC

## 2017-01-01 NOTE — Progress Notes (Signed)
Pt is subjective polyhydramnios. Scheduled growth scan for 01/04/17 and AFI will be completed at that time per MFM

## 2017-01-01 NOTE — Progress Notes (Signed)
NST performed today was reviewed and was found to be reactive.  AFI wasfound to be elevated at 27 cm on office scan; followup MFM scan ordered.  Continue recommended antenatal testing and prenatal care.

## 2017-01-02 ENCOUNTER — Telehealth: Payer: Self-pay | Admitting: Student

## 2017-01-02 ENCOUNTER — Other Ambulatory Visit: Payer: Self-pay | Admitting: Student

## 2017-01-02 MED ORDER — CLINDAMYCIN HCL 300 MG PO CAPS: 300.0000 mg | ORAL_CAPSULE | Freq: Three times a day (TID) | ORAL | 0 refills | Status: DC

## 2017-01-02 MED ORDER — PHENAZOPYRIDINE HCL 200 MG PO TABS: 200.0000 mg | ORAL_TABLET | Freq: Three times a day (TID) | ORAL | 0 refills | Status: DC | PRN

## 2017-01-02 NOTE — Telephone Encounter (Signed)
Spoke with patient to notify her of UTI results and her medications at the pharmacy (clinda and pyridium). Patient states that she is having some low back pain and still with continued burning with urination. She feels that the back pain may just be related to the baby's growing and positioning. She denies fever or other s/s of UTI. I recommended that she go to MAU if she develops fever, strong abdominal pain or if her symptoms do not go away 48 hours after starting her antibiotic. Patient verbalized understanding.  Luna KitchensKathryn Kooistra CNM

## 2017-01-04 ENCOUNTER — Other Ambulatory Visit: Payer: Self-pay | Admitting: Family Medicine

## 2017-01-04 ENCOUNTER — Ambulatory Visit (HOSPITAL_COMMUNITY)
Admission: RE | Admit: 2017-01-04 | Discharge: 2017-01-04 | Disposition: A | Discharge status: Home / Self Care | Payer: 59 | Source: Ambulatory Visit | Attending: Family Medicine | Admitting: Family Medicine

## 2017-01-04 ENCOUNTER — Encounter (HOSPITAL_COMMUNITY): Payer: Self-pay

## 2017-01-04 DIAGNOSIS — O24419 Gestational diabetes mellitus in pregnancy, unspecified control: Secondary | ICD-10-CM

## 2017-01-04 DIAGNOSIS — Z3483 Encounter for supervision of other normal pregnancy, third trimester: Secondary | ICD-10-CM | POA: Insufficient documentation

## 2017-01-04 DIAGNOSIS — O24415 Gestational diabetes mellitus in pregnancy, controlled by oral hypoglycemic drugs: Secondary | ICD-10-CM | POA: Diagnosis not present

## 2017-01-04 DIAGNOSIS — Z3A35 35 weeks gestation of pregnancy: Secondary | ICD-10-CM

## 2017-01-04 DIAGNOSIS — O99330 Smoking (tobacco) complicating pregnancy, unspecified trimester: Secondary | ICD-10-CM

## 2017-01-04 DIAGNOSIS — Z3480 Encounter for supervision of other normal pregnancy, unspecified trimester: Secondary | ICD-10-CM

## 2017-01-08 ENCOUNTER — Ambulatory Visit (INDEPENDENT_AMBULATORY_CARE_PROVIDER_SITE_OTHER): Payer: 59 | Admitting: Obstetrics & Gynecology

## 2017-01-08 ENCOUNTER — Encounter: Payer: 59 | Admitting: Obstetrics & Gynecology

## 2017-01-08 VITALS — BP 112/75 | HR 109 | Wt 171.0 lb

## 2017-01-08 DIAGNOSIS — O24419 Gestational diabetes mellitus in pregnancy, unspecified control: Secondary | ICD-10-CM

## 2017-01-08 DIAGNOSIS — O24415 Gestational diabetes mellitus in pregnancy, controlled by oral hypoglycemic drugs: Secondary | ICD-10-CM | POA: Diagnosis not present

## 2017-01-08 DIAGNOSIS — Z113 Encounter for screening for infections with a predominantly sexual mode of transmission: Secondary | ICD-10-CM | POA: Diagnosis not present

## 2017-01-08 DIAGNOSIS — R8271 Bacteriuria: Secondary | ICD-10-CM

## 2017-01-08 DIAGNOSIS — Z3480 Encounter for supervision of other normal pregnancy, unspecified trimester: Secondary | ICD-10-CM

## 2017-01-08 DIAGNOSIS — N760 Acute vaginitis: Secondary | ICD-10-CM

## 2017-01-08 DIAGNOSIS — O23593 Infection of other part of genital tract in pregnancy, third trimester: Secondary | ICD-10-CM

## 2017-01-08 DIAGNOSIS — O0993 Supervision of high risk pregnancy, unspecified, third trimester: Secondary | ICD-10-CM

## 2017-01-08 LAB — GLUCOSE, POCT (MANUAL RESULT ENTRY): POC GLUCOSE: 110 mg/dL — AB (ref 70–99)

## 2017-01-08 MED ORDER — FREESTYLE LITE DEVI: 0 refills | Status: DC

## 2017-01-08 MED ORDER — GLUCOSE BLOOD VI STRP: ORAL_STRIP | 12 refills | Status: DC

## 2017-01-08 MED ORDER — FLUCONAZOLE 150 MG PO TABS: 150.0000 mg | ORAL_TABLET | Freq: Once | ORAL | 3 refills | Status: AC

## 2017-01-08 NOTE — Patient Instructions (Signed)
Return to clinic for any scheduled appointments or obstetric concerns, or go to MAU for evaluation  

## 2017-01-08 NOTE — Addendum Note (Signed)
Addended by: Cheree DittoGRAHAM, Abb Gobert A on: 01/08/2017 04:14 PM   Modules accepted: Orders

## 2017-01-08 NOTE — Progress Notes (Signed)
   PRENATAL VISIT NOTE  Subjective:  Catherine Ruiz is a 26 y.o. G3P1011 at 5633w5d being seen today for ongoing prenatal care.  She is currently monitored for the following issues for this high-risk pregnancy and has Tobacco abuse; Supervision of high-risk pregnancy; Headache; Heart palpitations; Gestational diabetes mellitus (GDM) in third trimester controlled on oral hypoglycemic drug; Tobacco use affecting pregnancy, antepartum; and GBS bacteriuria on her problem list.  Patient reports vaginal irritation; recently treated for BV.  Mild irritation currently, no abnormal discharge.  Contractions: Irregular. Vag. Bleeding: None.  Movement: Present. Denies leaking of fluid. Has not been checking BS given that she recently misplaced her device and strips. Log was also in packet that was misplaced last week.   The following portions of the patient's history were reviewed and updated as appropriate: allergies, current medications, past family history, past medical history, past social history, past surgical history and problem list. Problem list updated.  Objective:   Vitals:   01/08/17 0923  BP: 112/75  Pulse: (!) 109  Weight: 171 lb (77.6 kg)    Fetal Status: Fetal Heart Rate (bpm): NST   Movement: Present  Presentation: Vertex  General:  Alert, oriented and cooperative. Patient is in no acute distress.  Skin: Skin is warm and dry. No rash noted.   Cardiovascular: Normal heart rate noted  Respiratory: Normal respiratory effort, no problems with respiration noted  Abdomen: Soft, gravid, appropriate for gestational age.  Pain/Pressure: Present     Pelvic: Cervical exam performed Dilation: Closed Effacement (%): Thick Station: Ballotable. Testing sample obtained.  Extremities: Normal range of motion.  Edema: None  Mental Status:  Normal mood and affect. Normal behavior. Normal judgment and thought content.    Assessment and Plan:  Pregnancy: G3P1011 at 4533w5d  1. Gestational diabetes  mellitus (GDM) in third trimester controlled on oral hypoglycemic drug - POCT Glucose (CBG) random today was 110.   Continue Metformin. - Fetal nonstress test done today and was reactive.  Continue recommended antenatal testing and prenatal care. BPP scheduled for later this week - US MFM FETAL BPP WO NON STRESS; Future Meter and strips reordered, evaluate next week. - glucose blood (FREESTYLE LITE) test strip; Use as instructed  Dispense: 100 each; Refill: 12 - Blood Glucose Monitoring Suppl (FREESTYLE LITE) DEVI; Use as directed  Dispense: 1 each; Refill: 0  2. Vaginitis during pregnancy in third trimester Likely rebound yeast infection, Diflucan given (patient declined Terazol). Follow up test.  - Cervicovaginal ancillary only - fluconazole (DIFLUCAN) 150 MG tablet; Take 1 tablet (150 mg total) by mouth once. Can take additional dose three days later if symptoms persist  Dispense: 1 tablet; Refill: 3  3. GBS bacteriuria + GBS in urine on 12-26-2016. Can tolerate PCN even though allergic to Keflex (nausea and vomitng).  PCN was used for intrapartum prophylaxis last pregnancy  4. Supervision of high risk pregnancy in third trimester Preterm labor symptoms and general obstetric precautions including but not limited to vaginal bleeding, contractions, leaking of fluid and fetal movement were reviewed in detail with the patient. Please refer to After Visit Summary for other counseling recommendations.  Return in about 7 days (around 01/15/2017) for OB Visit, NST.   Jaynie CollinsUgonna Wayman Hoard, MD

## 2017-01-09 ENCOUNTER — Other Ambulatory Visit: Payer: Self-pay

## 2017-01-09 DIAGNOSIS — O24415 Gestational diabetes mellitus in pregnancy, controlled by oral hypoglycemic drugs: Secondary | ICD-10-CM

## 2017-01-09 LAB — CERVICOVAGINAL ANCILLARY ONLY
BACTERIAL VAGINITIS: NEGATIVE
CANDIDA VAGINITIS: POSITIVE — AB
CHLAMYDIA, DNA PROBE: NEGATIVE
NEISSERIA GONORRHEA: NEGATIVE
Trichomonas: NEGATIVE

## 2017-01-09 MED ORDER — FREESTYLE LITE DEVI: 0 refills | Status: DC

## 2017-01-09 MED ORDER — FREESTYLE LITE DEVI: 1 refills | Status: DC

## 2017-01-11 ENCOUNTER — Encounter (HOSPITAL_COMMUNITY): Payer: Self-pay

## 2017-01-11 ENCOUNTER — Ambulatory Visit (HOSPITAL_COMMUNITY)
Admission: RE | Admit: 2017-01-11 | Discharge: 2017-01-11 | Disposition: A | Discharge status: Home / Self Care | Payer: 59 | Source: Ambulatory Visit | Attending: Family Medicine | Admitting: Family Medicine

## 2017-01-11 DIAGNOSIS — O24415 Gestational diabetes mellitus in pregnancy, controlled by oral hypoglycemic drugs: Secondary | ICD-10-CM | POA: Insufficient documentation

## 2017-01-11 DIAGNOSIS — Z3A36 36 weeks gestation of pregnancy: Secondary | ICD-10-CM | POA: Diagnosis not present

## 2017-01-15 ENCOUNTER — Ambulatory Visit (INDEPENDENT_AMBULATORY_CARE_PROVIDER_SITE_OTHER): Payer: 59 | Admitting: Family Medicine

## 2017-01-15 DIAGNOSIS — O24415 Gestational diabetes mellitus in pregnancy, controlled by oral hypoglycemic drugs: Secondary | ICD-10-CM

## 2017-01-15 DIAGNOSIS — O0993 Supervision of high risk pregnancy, unspecified, third trimester: Secondary | ICD-10-CM

## 2017-01-15 DIAGNOSIS — R8271 Bacteriuria: Secondary | ICD-10-CM

## 2017-01-15 MED ORDER — METFORMIN HCL 500 MG PO TABS: 750.0000 mg | ORAL_TABLET | Freq: Every day | ORAL | 2 refills | Status: DC

## 2017-01-15 NOTE — Progress Notes (Signed)
   PRENATAL VISIT NOTE  Subjective:  Catherine Ruiz is a 26 y.o. G3P1011 at 6631w6d being seen today for ongoing prenatal care.  She is currently monitored for the following issues for this high-risk pregnancy and has Tobacco abuse; Supervision of high-risk pregnancy; Headache; Heart palpitations; Gestational diabetes mellitus (GDM) in third trimester controlled on oral hypoglycemic drug; Tobacco use affecting pregnancy, antepartum; and GBS bacteriuria on her problem list.  Patient reports no complaints. Denies leaking of fluid.   The following portions of the patient's history were reviewed and updated as appropriate: allergies, current medications, past family history, past medical history, past social history, past surgical history and problem list. Problem list updated.  Objective:  There were no vitals filed for this visit.  Fetal Status:  NST  General:  Alert, oriented and cooperative. Patient is in no acute distress.  Skin: Skin is warm and dry. No rash noted.   Cardiovascular: Normal heart rate noted  Respiratory: Normal respiratory effort, no problems with respiration noted  Abdomen: Soft, gravid, appropriate for gestational age.        Pelvic: Cervical exam deferred        Extremities: Normal range of motion.     Mental Status:  Normal mood and affect. Normal behavior. Normal judgment and thought content.  FBS 104-106 2 hour pp 102-117 NST:  Baseline: 130 bpm, Variability: Good {> 6 bpm), Accelerations: Reactive and Decelerations: Absent U/s growth on 10/21 vtx, nml AFI, EFW 6 lb 2 oz (75%) AC 95% Assessment and Plan:  Pregnancy: G3P1011 at 3731w6d  1. Supervision of high risk pregnancy in third trimester GBS in urine  2. Gestational diabetes mellitus (GDM) in third trimester controlled on oral hypoglycemic drug Continue metformin--fastings are consistently out--increase to 1.5 tabs q pm - Fetal nonstress test - metFORMIN (GLUCOPHAGE) 500 MG tablet; Take 1.5 tablets (750  mg total) by mouth at bedtime.  Dispense: 60 tablet; Refill: 2  3. GBS bacteriuria Will need treatment in labor  Preterm labor symptoms and general obstetric precautions including but not limited to vaginal bleeding, contractions, leaking of fluid and fetal movement were reviewed in detail with the patient. Please refer to After Visit Summary for other counseling recommendations.  No Follow-up on file.   Reva Boresanya S Lory Nowaczyk, MD

## 2017-01-16 ENCOUNTER — Ambulatory Visit (HOSPITAL_COMMUNITY): Payer: 59

## 2017-01-18 ENCOUNTER — Ambulatory Visit (INDEPENDENT_AMBULATORY_CARE_PROVIDER_SITE_OTHER): Payer: 59 | Admitting: *Deleted

## 2017-01-18 VITALS — BP 109/68 | HR 106 | Wt 173.0 lb

## 2017-01-18 DIAGNOSIS — O24415 Gestational diabetes mellitus in pregnancy, controlled by oral hypoglycemic drugs: Secondary | ICD-10-CM | POA: Diagnosis not present

## 2017-01-21 ENCOUNTER — Telehealth (HOSPITAL_COMMUNITY): Payer: Self-pay | Admitting: *Deleted

## 2017-01-21 NOTE — Telephone Encounter (Signed)
Preadmission screen  

## 2017-01-22 ENCOUNTER — Ambulatory Visit (INDEPENDENT_AMBULATORY_CARE_PROVIDER_SITE_OTHER): Payer: 59 | Admitting: Obstetrics & Gynecology

## 2017-01-22 DIAGNOSIS — R8271 Bacteriuria: Secondary | ICD-10-CM

## 2017-01-22 DIAGNOSIS — O0993 Supervision of high risk pregnancy, unspecified, third trimester: Secondary | ICD-10-CM

## 2017-01-22 DIAGNOSIS — O24415 Gestational diabetes mellitus in pregnancy, controlled by oral hypoglycemic drugs: Secondary | ICD-10-CM

## 2017-01-22 NOTE — Progress Notes (Signed)
   PRENATAL VISIT NOTE  Subjective:  Catherine Ruiz is a 26 y.o. G3P1011 at 3566w5d being seen today for ongoing prenatal care.  She is currently monitored for the following issues for this high-risk pregnancy and has Tobacco abuse; Supervision of high-risk pregnancy; Headache; Heart palpitations; Gestational diabetes mellitus (GDM) in third trimester controlled on oral hypoglycemic drug; Tobacco use affecting pregnancy, antepartum; and GBS bacteriuria on their problem list.  Patient reports no complaints.   .  .   . Denies leaking of fluid.   The following portions of the patient's history were reviewed and updated as appropriate: allergies, current medications, past family history, past medical history, past social history, past surgical history and problem list. Problem list updated.  Objective:  There were no vitals filed for this visit.  Fetal Status:           General:  Alert, oriented and cooperative. Patient is in no acute distress.  Skin: Skin is warm and dry. No rash noted.   Cardiovascular: Normal heart rate noted  Respiratory: Normal respiratory effort, no problems with respiration noted  Abdomen: Soft, gravid, appropriate for gestational age.        Pelvic: Cervical exam deferred        Extremities: Normal range of motion.     Mental Status:  Normal mood and affect. Normal behavior. Normal judgment and thought content.   Assessment and Plan:  Pregnancy: G3P1011 at 2266w5d  1. GBS bacteriuria - treat in labor  2. Supervision of high risk pregnancy in third trimester -IOL scheduled for 39 weeks  3. Gestational diabetes mellitus (GDM) in third trimester controlled on oral hypoglycemic drug - fasting sugars are much improved since increasing metformin to 750 mg at supper time  Preterm labor symptoms and general obstetric precautions including but not limited to vaginal bleeding, contractions, leaking of fluid and fetal movement were reviewed in detail with the  patient. Please refer to After Visit Summary for other counseling recommendations.  No Follow-up on file.   Allie BossierMyra C Azuree Minish, MD

## 2017-01-23 ENCOUNTER — Encounter (HOSPITAL_COMMUNITY): Payer: Self-pay | Admitting: *Deleted

## 2017-01-23 ENCOUNTER — Telehealth (HOSPITAL_COMMUNITY): Payer: Self-pay | Admitting: *Deleted

## 2017-01-23 NOTE — Telephone Encounter (Signed)
Preadmission screen  

## 2017-01-25 ENCOUNTER — Ambulatory Visit (INDEPENDENT_AMBULATORY_CARE_PROVIDER_SITE_OTHER): Payer: 59 | Admitting: *Deleted

## 2017-01-25 VITALS — BP 116/74 | HR 95

## 2017-01-25 DIAGNOSIS — O24415 Gestational diabetes mellitus in pregnancy, controlled by oral hypoglycemic drugs: Secondary | ICD-10-CM | POA: Diagnosis not present

## 2017-01-26 NOTE — Progress Notes (Signed)
NST Note Date: 01/25/2017 Gestational Age: 25/1 FHT: 135 baseline, positive accelerations, negative deceleration, moderate variability Toco: quiet Time: 25 minutes  A/P: rNST. Continue current care  Cornelia Copaharlie Raunel Dimartino, Jr MD Attending Center for [facility](Faculty Practice)

## 2017-01-29 ENCOUNTER — Encounter: Payer: Self-pay | Admitting: Obstetrics and Gynecology

## 2017-01-29 ENCOUNTER — Ambulatory Visit (INDEPENDENT_AMBULATORY_CARE_PROVIDER_SITE_OTHER): Payer: 59 | Admitting: Obstetrics and Gynecology

## 2017-01-29 VITALS — BP 109/66 | HR 98

## 2017-01-29 DIAGNOSIS — R8271 Bacteriuria: Secondary | ICD-10-CM

## 2017-01-29 DIAGNOSIS — O24415 Gestational diabetes mellitus in pregnancy, controlled by oral hypoglycemic drugs: Secondary | ICD-10-CM

## 2017-01-29 NOTE — Progress Notes (Signed)
Prenatal Visit Note Date: 01/29/2017 Clinic: [facility]  Subjective:  Catherine Ruiz is a 26 y.o. G3P1011 at 7373w5d being seen today for ongoing prenatal care.  She is currently monitored for the following issues for this high-risk pregnancy and has Tobacco abuse; Supervision of high-risk pregnancy; Headache; Heart palpitations; Gestational diabetes mellitus (GDM) in third trimester controlled on oral hypoglycemic drug; Tobacco use affecting pregnancy, antepartum; and GBS bacteriuria on their problem list.  Patient reports no complaints.   Contractions: Irregular. Vag. Bleeding: None.  Movement: Present. Denies leaking of fluid.   The following portions of the patient's history were reviewed and updated as appropriate: allergies, current medications, past family history, past medical history, past social history, past surgical history and problem list. Problem list updated.  Objective:   Vitals:   01/29/17 1544  BP: 109/66  Pulse: 98    Fetal Status: Fetal Heart Rate (bpm): NST   Movement: Present  Presentation: Vertex  General:  Alert, oriented and cooperative. Patient is in no acute distress.  Skin: Skin is warm and dry. No rash noted.   Cardiovascular: Normal heart rate noted  Respiratory: Normal respiratory effort, no problems with respiration noted  Abdomen: Soft, gravid, appropriate for gestational age. Pain/Pressure: Present     Pelvic:  Cervical exam performed Dilation: 3.5 Effacement (%): 50 Station: -1  Extremities: Normal range of motion.  Edema: None  Mental Status: Normal mood and affect. Normal behavior. Normal judgment and thought content.   Urinalysis:      Assessment and Plan:  Pregnancy: G3P1011 at 4973w5d  1. Gestational diabetes mellitus (GDM) in third trimester controlled on oral hypoglycemic drug Normal bs values on metformin 750 with dinner. rNST and normal AFI today. Normal growth with slightly high ac 7669m ago. Set up for iol on Friday.  D/w her likely will do pitocin  2. GBS bacteriuria tx on admission  Term labor symptoms and general obstetric precautions including but not limited to vaginal bleeding, contractions, leaking of fluid and fetal movement were reviewed in detail with the patient. Please refer to After Visit Summary for other counseling recommendations.   RTC: PP visit  Renville BingPickens, Odin Mariani, MD

## 2017-01-31 ENCOUNTER — Inpatient Hospital Stay (HOSPITAL_COMMUNITY): Payer: 59 | Admitting: Anesthesiology

## 2017-01-31 ENCOUNTER — Encounter (HOSPITAL_COMMUNITY): Payer: Self-pay | Admitting: Anesthesiology

## 2017-01-31 ENCOUNTER — Encounter (HOSPITAL_COMMUNITY): Payer: Self-pay

## 2017-01-31 ENCOUNTER — Inpatient Hospital Stay (HOSPITAL_COMMUNITY)
Admission: RE | Admit: 2017-01-31 | Discharge: 2017-02-02 | DRG: 807 | Disposition: A | Discharge status: Home / Self Care | Payer: 59 | Source: Ambulatory Visit | Attending: Obstetrics & Gynecology | Admitting: Obstetrics & Gynecology

## 2017-01-31 DIAGNOSIS — O99824 Streptococcus B carrier state complicating childbirth: Secondary | ICD-10-CM | POA: Diagnosis present

## 2017-01-31 DIAGNOSIS — D649 Anemia, unspecified: Secondary | ICD-10-CM | POA: Diagnosis present

## 2017-01-31 DIAGNOSIS — O99334 Smoking (tobacco) complicating childbirth: Secondary | ICD-10-CM | POA: Diagnosis present

## 2017-01-31 DIAGNOSIS — O24429 Gestational diabetes mellitus in childbirth, unspecified control: Secondary | ICD-10-CM | POA: Diagnosis not present

## 2017-01-31 DIAGNOSIS — F17210 Nicotine dependence, cigarettes, uncomplicated: Secondary | ICD-10-CM | POA: Diagnosis present

## 2017-01-31 DIAGNOSIS — O9962 Diseases of the digestive system complicating childbirth: Secondary | ICD-10-CM | POA: Diagnosis present

## 2017-01-31 DIAGNOSIS — O9902 Anemia complicating childbirth: Secondary | ICD-10-CM | POA: Diagnosis present

## 2017-01-31 DIAGNOSIS — O24425 Gestational diabetes mellitus in childbirth, controlled by oral hypoglycemic drugs: Secondary | ICD-10-CM | POA: Diagnosis present

## 2017-01-31 DIAGNOSIS — K219 Gastro-esophageal reflux disease without esophagitis: Secondary | ICD-10-CM | POA: Diagnosis present

## 2017-01-31 DIAGNOSIS — Z3A39 39 weeks gestation of pregnancy: Secondary | ICD-10-CM | POA: Diagnosis not present

## 2017-01-31 DIAGNOSIS — Z8632 Personal history of gestational diabetes: Secondary | ICD-10-CM

## 2017-01-31 DIAGNOSIS — O99330 Smoking (tobacco) complicating pregnancy, unspecified trimester: Secondary | ICD-10-CM

## 2017-01-31 DIAGNOSIS — Z3A38 38 weeks gestation of pregnancy: Secondary | ICD-10-CM | POA: Diagnosis not present

## 2017-01-31 DIAGNOSIS — O24415 Gestational diabetes mellitus in pregnancy, controlled by oral hypoglycemic drugs: Secondary | ICD-10-CM

## 2017-01-31 DIAGNOSIS — O24419 Gestational diabetes mellitus in pregnancy, unspecified control: Secondary | ICD-10-CM | POA: Diagnosis not present

## 2017-01-31 LAB — CBC
HEMATOCRIT: 33.4 % — AB (ref 36.0–46.0)
HEMOGLOBIN: 10.9 g/dL — AB (ref 12.0–15.0)
MCH: 30.1 pg (ref 26.0–34.0)
MCHC: 32.6 g/dL (ref 30.0–36.0)
MCV: 92.3 fL (ref 78.0–100.0)
Platelets: 393 10*3/uL (ref 150–400)
RBC: 3.62 MIL/uL — AB (ref 3.87–5.11)
RDW: 13.9 % (ref 11.5–15.5)
WBC: 12.8 10*3/uL — ABNORMAL HIGH (ref 4.0–10.5)

## 2017-01-31 LAB — RPR: RPR: NONREACTIVE

## 2017-01-31 LAB — GLUCOSE, CAPILLARY
GLUCOSE-CAPILLARY: 88 mg/dL (ref 65–99)
Glucose-Capillary: 93 mg/dL (ref 65–99)
Glucose-Capillary: 95 mg/dL (ref 65–99)

## 2017-01-31 LAB — TYPE AND SCREEN
ABO/RH(D): O POS
ANTIBODY SCREEN: NEGATIVE

## 2017-01-31 MED ORDER — PENICILLIN G POT IN DEXTROSE 60000 UNIT/ML IV SOLN
3.0000 10*6.[IU] | INTRAVENOUS | Status: DC
  Administered 2017-01-31 (×2): 3 10*6.[IU] via INTRAVENOUS
  Filled 2017-01-31 (×7): qty 50

## 2017-01-31 MED ORDER — PHENYLEPHRINE 40 MCG/ML (10ML) SYRINGE FOR IV PUSH (FOR BLOOD PRESSURE SUPPORT)
80.0000 ug | PREFILLED_SYRINGE | INTRAVENOUS | Status: DC | PRN
  Filled 2017-01-31: qty 5
  Filled 2017-01-31: qty 10

## 2017-01-31 MED ORDER — EPHEDRINE 5 MG/ML INJ
10.0000 mg | INTRAVENOUS | Status: DC | PRN
  Filled 2017-01-31: qty 2

## 2017-01-31 MED ORDER — OXYTOCIN BOLUS FROM INFUSION: 500.0000 mL | Freq: Once | INTRAVENOUS | Status: DC

## 2017-01-31 MED ORDER — TERBUTALINE SULFATE 1 MG/ML IJ SOLN
0.2500 mg | Freq: Once | INTRAMUSCULAR | Status: DC | PRN
  Filled 2017-01-31: qty 1

## 2017-01-31 MED ORDER — IBUPROFEN 600 MG PO TABS
600.0000 mg | ORAL_TABLET | Freq: Four times a day (QID) | ORAL | Status: DC
  Administered 2017-02-01 – 2017-02-02 (×7): 600 mg via ORAL
  Filled 2017-01-31 (×7): qty 1

## 2017-01-31 MED ORDER — PENICILLIN G POTASSIUM 5000000 UNITS IJ SOLR
5.0000 10*6.[IU] | Freq: Once | INTRAMUSCULAR | Status: AC
  Administered 2017-01-31: 5 10*6.[IU] via INTRAVENOUS
  Filled 2017-01-31: qty 5

## 2017-01-31 MED ORDER — LACTATED RINGERS IV SOLN
INTRAVENOUS | Status: DC
  Administered 2017-01-31: 18:00:00 via INTRAVENOUS
  Administered 2017-01-31: 125 mL/h via INTRAVENOUS

## 2017-01-31 MED ORDER — LACTATED RINGERS IV SOLN: 500.0000 mL | Freq: Once | INTRAVENOUS | Status: DC

## 2017-01-31 MED ORDER — FENTANYL 2.5 MCG/ML BUPIVACAINE 1/10 % EPIDURAL INFUSION (WH - ANES)
14.0000 mL/h | INTRAMUSCULAR | Status: DC | PRN
  Administered 2017-01-31: 14 mL/h via EPIDURAL
  Filled 2017-01-31: qty 100

## 2017-01-31 MED ORDER — OXYTOCIN 40 UNITS IN LACTATED RINGERS INFUSION - SIMPLE MED: 2.5000 [IU]/h | INTRAVENOUS | Status: DC

## 2017-01-31 MED ORDER — LIDOCAINE HCL (PF) 1 % IJ SOLN
30.0000 mL | INTRAMUSCULAR | Status: DC | PRN
  Filled 2017-01-31: qty 30

## 2017-01-31 MED ORDER — FLEET ENEMA 7-19 GM/118ML RE ENEM: 1.0000 | ENEMA | RECTAL | Status: DC | PRN

## 2017-01-31 MED ORDER — ACETAMINOPHEN 325 MG PO TABS: 650.0000 mg | ORAL_TABLET | ORAL | Status: DC | PRN

## 2017-01-31 MED ORDER — OXYCODONE-ACETAMINOPHEN 5-325 MG PO TABS: 2.0000 | ORAL_TABLET | ORAL | Status: DC | PRN

## 2017-01-31 MED ORDER — FENTANYL CITRATE (PF) 100 MCG/2ML IJ SOLN: 50.0000 ug | INTRAMUSCULAR | Status: DC | PRN

## 2017-01-31 MED ORDER — OXYTOCIN 40 UNITS IN LACTATED RINGERS INFUSION - SIMPLE MED
1.0000 m[IU]/min | INTRAVENOUS | Status: DC
  Administered 2017-01-31: 2 m[IU]/min via INTRAVENOUS
  Filled 2017-01-31: qty 1000

## 2017-01-31 MED ORDER — LACTATED RINGERS IV SOLN: 500.0000 mL | INTRAVENOUS | Status: DC | PRN

## 2017-01-31 MED ORDER — DIPHENHYDRAMINE HCL 50 MG/ML IJ SOLN: 12.5000 mg | INTRAMUSCULAR | Status: DC | PRN

## 2017-01-31 MED ORDER — PHENYLEPHRINE 40 MCG/ML (10ML) SYRINGE FOR IV PUSH (FOR BLOOD PRESSURE SUPPORT)
80.0000 ug | PREFILLED_SYRINGE | INTRAVENOUS | Status: DC | PRN
  Filled 2017-01-31: qty 5

## 2017-01-31 MED ORDER — SOD CITRATE-CITRIC ACID 500-334 MG/5ML PO SOLN
30.0000 mL | ORAL | Status: DC | PRN
  Administered 2017-01-31 (×2): 30 mL via ORAL
  Filled 2017-01-31 (×2): qty 15

## 2017-01-31 MED ORDER — OXYCODONE-ACETAMINOPHEN 5-325 MG PO TABS: 1.0000 | ORAL_TABLET | ORAL | Status: DC | PRN

## 2017-01-31 MED ORDER — ONDANSETRON HCL 4 MG/2ML IJ SOLN: 4.0000 mg | Freq: Four times a day (QID) | INTRAMUSCULAR | Status: DC | PRN

## 2017-01-31 MED ORDER — LIDOCAINE HCL (PF) 1 % IJ SOLN
INTRAMUSCULAR | Status: DC | PRN
  Administered 2017-01-31 (×2): 4 mL via EPIDURAL

## 2017-01-31 NOTE — Anesthesia Procedure Notes (Signed)
Epidural Patient location during procedure: OB Start time: 01/31/2017 2:20 PM  Staffing Anesthesiologist: Leonides GrillsEllender, Ryan P, MD Performed: anesthesiologist   Preanesthetic Checklist Completed: patient identified, site marked, pre-op evaluation, timeout performed, IV checked, risks and benefits discussed and monitors and equipment checked  Epidural Patient position: sitting Prep: DuraPrep Patient monitoring: heart rate, cardiac monitor, continuous pulse ox and blood pressure Approach: midline Location: L3-L4 Injection technique: LOR air  Needle:  Needle type: Tuohy  Needle gauge: 17 G Needle length: 9 cm Needle insertion depth: 5 cm Catheter type: closed end flexible Catheter size: 19 Gauge Catheter at skin depth: 10 cm Test dose: negative and Other  Assessment Events: blood not aspirated, injection not painful, no injection resistance and negative IV test  Additional Notes Informed consent obtained prior to proceeding including risk of failure, 1% risk of PDPH, risk of minor discomfort and bruising. Discussed alternatives to epidural analgesia and patient desires to proceed.  Timeout performed pre-procedure verifying patient name, procedure, and platelet count.  Patient tolerated procedure well. Reason for block:procedure for pain

## 2017-01-31 NOTE — Progress Notes (Addendum)
Labor Progress Note Adele SchilderMichelle L Stickle is a 26 y.o. G3P1011 at 2192w0d presented for IOL for A2GDM  S:  Patient reporting a big gush of clear fluid and worsening contrations.  O:  BP (!) 85/37 (BP Location: Right Arm)   Pulse 95   Temp 98.1 F (36.7 C) (Oral)   Resp 18   Ht 5\' 4"  (1.626 m)   Wt 173 lb (78.5 kg)   LMP 05/03/2016 (Exact Date)   BMI 29.70 kg/m   Fetal Tracing:  Baseline: 130 Variability: moderate Accels: 15x15 Decels: none  Toco: 2-3  CVE: Dilation: 3.5 Effacement (%): 80 Cervical Position: Middle Station: -1, -2 Presentation: Vertex Exam by:: Gifford ShaveYancey Luft RN    A&P: 26 y.o. G3P1011 1892w0d IOL for A2GDM, SROM with large amount of clear fluid #Labor: Progressing well. Continue pitocin #Pain: epidural #FWB: Cat 1 #GBS positive  Rolm Bookbinderaroline M Sharie Amorin, CNM 2:30 PM

## 2017-01-31 NOTE — Anesthesia Pain Management Evaluation Note (Signed)
  CRNA Pain Management Visit Note  Patient: Catherine Ruiz, 26 y.o., female  "Hello I am a member of the anesthesia team at [facility]. We have an anesthesia team available at all times to provide care throughout the hospital, including epidural management and anesthesia for C-section. I don't know your plan for the delivery whether it a natural birth, water birth, IV sedation, nitrous supplementation, doula or epidural, but we want to meet your pain goals."   1.Was your pain managed to your expectations on prior hospitalizations?   Yes   2.What is your expectation for pain management during this hospitalization?     Epidural  3.How can we help you reach that goal? epidural  Record the patient's initial score and the patient's pain goal.   Pain: 0  Pain Goal: 5 [facility] wants you to be able to say your pain was always managed very well.  Lakiesha Ralphs 01/31/2017

## 2017-01-31 NOTE — Progress Notes (Signed)
Labor Progress Note Catherine Ruiz is a 26 y.o. G3P1011 at 1529w0d presented for IOL for A2GDM  S:  Patient comfortable, resting with family at bedside  O:  BP (!) 104/40   Pulse 82   Temp 98.5 F (36.9 C) (Oral)   Resp 16   Ht 5\' 4"  (1.626 m)   Wt 173 lb (78.5 kg)   LMP 05/03/2016 (Exact Date)   BMI 29.70 kg/m   Fetal Tracing:  Baseline: 145 Variability: moderate Accels: 15x15 Decels: early  Toco: 2-3  CVE: Dilation: 6.5 Effacement (%): 70 Cervical Position: Middle Station: -2 Presentation: Vertex Exam by:: neill,cnm   A&P: 26 y.o. G3P1011 3729w0d IOL for A2GDM #Labor: No cervical change since previous exam. Discussed with patient risks and benefits of IUPC placement. Patient agreeable to plan. IUPC placed without difficulty and patient tolerate procedure well. #Pain: epidural #FWB: Cat 1 #GBS positive  Rolm Bookbinderaroline M Neill, CNM 6:23 PM

## 2017-01-31 NOTE — H&P (Signed)
OBSTETRIC ADMISSION HISTORY AND PHYSICAL  Catherine Ruiz is a 26 y.o. female G3P1011 with IUP at 7076w0d by LMP (05/03/16) presenting for IOL for A2GDM (Metformin 750mg  nightly). She reports +FMs, No LOF, no VB, no blurry vision, headaches or peripheral edema, and RUQ pain.  She plans on bottle feeding. She is unsure about what she wants for birth control. She decided that she did not want to do BTL, [facility].   Dating: By LMP (05/03/16) --->  Estimated Date of Delivery: 02/07/17  Sono:  01/04/17 @[redacted]w[redacted]d , CWD, normal anatomy, Cephalic presentation,  2766g, 75% EFW, 95% AC, nml AFV; Pelvis proven to 7'13   Prenatal History/Complications: -Gestational diabetes mellitus in third trimester on hypoglycemic drug(Metformin 750mg  nightly) -Supervision of high-risk pregnancy -Tobacco use affecting pregnancy -GBS bacteriuria  Past Medical History: Past Medical History:  Diagnosis Date  . GERD (gastroesophageal reflux disease)   . Gestational diabetes    this pregnancy on metformin  . Miscarriage   . PONV (postoperative nausea and vomiting)   . Pregnancy   . Ruptured spleen   . UTI (lower urinary tract infection)     Past Surgical History: Past Surgical History:  Procedure Laterality Date  . APPENDECTOMY  2001  . CHOLECYSTECTOMY N/A 10/12/2014   Procedure: LAPAROSCOPIC CHOLECYSTECTOMY WITH INTRAOPERATIVE CHOLANGIOGRAM;  Surgeon: Jimmye NormanJames Wyatt, MD;  Location: [facility] OR;  Service: General;  Laterality: N/A;  . MOUTH SURGERY      Obstetrical History: OB History    Gravida Para Term Preterm AB Living   3 1 1   1 1    SAB TAB Ectopic Multiple Live Births   1     0 1      Social History: Social History   Socioeconomic History  . Marital status: Married    Spouse name: None  . Number of children: None  . Years of education: None  . Highest education level: None  Social Needs  . Financial resource strain: None  . Food insecurity - worry: None  .  Food insecurity - inability: None  . Transportation needs - medical: None  . Transportation needs - non-medical: None  Occupational History  . None  Tobacco Use  . Smoking status: Current Every Day Smoker    Packs/day: 0.50    Years: 3.00    Pack years: 1.50    Types: Cigarettes  . Smokeless tobacco: Never Used  . Tobacco comment: cutting back/trying to quit  Substance and Sexual Activity  . Alcohol use: No    Alcohol/week: 0.0 oz  . Drug use: No  . Sexual activity: Yes    Partners: Male    Birth control/protection: None  Other Topics Concern  . None  Social History Narrative   Patient is engaged and will be married next year.   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   [place] in [place] her [place]iance.    Family History: Family History  Problem Relation Age of Onset  . Asthma Sister   . Asthma Brother     Allergies: Allergies  Allergen Reactions  . Aspirin Nausea Only  . Keflex [Cephalexin] Nausea And Vomiting  . Sulfa Antibiotics Rash    Joints "lock up"    Medications Prior to Admission  Medication Sig Dispense Refill Last Dose  . Prenatal Vit-Fe Fumarate-FA (MULTIVITAMIN-PRENATAL) 27-0.8 MG TABS tablet Take 1 tablet by mouth daily at 12 noon.   01/30/2017 at Unknown time  . ranitidine (ZANTAC) 150  MG tablet Take 150 mg daily as needed by mouth for heartburn.    01/31/2017 at Unknown time  . Blood Glucose Monitoring Suppl (FREESTYLE LITE) DEVI Use as directed 1 each 1 Taking  . Blood Glucose Monitoring Suppl (FREESTYLE LITE) DEVI Use as directed 1 each 0 Taking  . glucose blood (FREESTYLE LITE) test strip Please check blood sugar 4 times a day  FASTING MORNING AFTERNOON  DINNER 100 each 12 Taking  . glucose blood test strip Use as instructed 100 each 12 Taking  . Lancets (FREESTYLE) lancets Use as instructed 100 each 12 Taking  . metFORMIN (GLUCOPHAGE) 500 MG tablet Take 1.5 tablets (750 mg total) by mouth at bedtime. (Patient taking  differently: Take 750 mg every evening by mouth. ) 60 tablet 2 01/29/2017     Review of Systems   All systems reviewed and negative except as stated in HPI  Blood pressure 118/71, pulse 93, temperature 98.1 F (36.7 C), temperature source Oral, resp. rate 18, height 5\' 4"  (1.626 m), weight 173 lb (78.5 kg), last menstrual period 05/03/2016. General appearance: alert, cooperative and appears stated age Lungs: clear to auscultation bilaterally Heart: regular rate and rhythm Abdomen: soft, non-tender; bowel sounds normal Extremities: Homans sign is negative, no sign of DVT Presentation: cephalic Fetal monitoringBaseline: 150 bpm, Variability: Good {> 6 bpm) and Accelerations: Reactive Uterine activityNone Dilation: 3 Effacement (%): 50 Station: -2 Exam by:: Kris Hartmann, RN   Prenatal labs: ABO, Rh: --/--/O POS (11/15 0740) Antibody: NEG (11/15 0740) Rubella: 3.45 (04/25 1600) RPR: Non Reactive (09/07 0816)  HBsAg: Negative (04/25 1600)  HIV:   Non Reactive (04/25 1600) GBS: Positive (10/10 0000)  2 hr Glucola fasting 95, 1hr 107, 2hr 94 Genetic screening  declined Anatomy US normal  Prenatal Transfer Tool  Maternal Diabetes: Yes:  Diabetes Type:  Insulin/Medication controlled Genetic Screening: Declined Maternal Ultrasounds/Referrals: Normal Fetal Ultrasounds or other Referrals:  None Maternal Substance Abuse:  Yes:  Type: Smoker Significant Maternal Medications:  Meds include: Other:  Significant Maternal Lab Results: Lab values include: Group B Strep positive  Results for orders placed or performed during the hospital encounter of 01/31/17 (from the past 24 hour(s))  CBC   Collection Time: 01/31/17  7:40 AM  Result Value Ref Range   WBC 12.8 (H) 4.0 - 10.5 K/uL   RBC 3.62 (L) 3.87 - 5.11 MIL/uL   Hemoglobin 10.9 (L) 12.0 - 15.0 g/dL   HCT 16.1 (L) 09.6 - 04.5 %   MCV 92.3 78.0 - 100.0 fL   MCH 30.1 26.0 - 34.0 pg   MCHC 32.6 30.0 - 36.0 g/dL   RDW 40.9 81.1 -  91.4 %   Platelets 393 150 - 400 K/uL  Type and screen [facility]   Collection Time: 01/31/17  7:40 AM  Result Value Ref Range   ABO/RH(D) O POS    Antibody Screen NEG    Sample Expiration 02/03/2017   Glucose, capillary   Collection Time: 01/31/17  7:45 AM  Result Value Ref Range   Glucose-Capillary 88 65 - 99 mg/dL    Patient Active Problem List   Diagnosis Date Noted  . GDM, class A2 01/31/2017  . GBS bacteriuria 01/01/2017  . Tobacco use affecting pregnancy, antepartum 12/19/2016  . Gestational diabetes mellitus (GDM) in third trimester controlled on oral hypoglycemic drug 11/25/2016  . Heart palpitations 08/22/2016  . Headache 07/25/2016  . Supervision of high-risk pregnancy 12/02/2013  . Tobacco abuse 07/28/2012  Assessment/Plan:  Catherine Ruiz is a 26 y.o. G3P1011 at 522w0d here for IOL for A2GDM.  #Labor: IOL > Start Pitocin. Continue with pitocin and consider AROM if needed #Pain: Per patient request #FWB: Cat 1  #ID:  GBS pos-PCN #MOF: bottle #MOC: Changed mind about BTL, unsure at this time. Would like more information #Circ:  Yes inpatient  Suella BroadKeriann S Minott, MD  01/31/2017, 10:04 AM  Midwife attestation: I have seen and examined this patient; I agree with above documentation in the resident's note.   Catherine Ruiz is a 26 y.o. [phone] here for IOL for A2GDM  PE: Gen: calm comfortable, NAD Resp: normal effort, no distress Abd: gravid  ROS, labs, PMH reviewed  Assessment/Plan: Admit to LD Labor: Cervix favorable, IOL >Pitocin FWB: Cat I ID: GBS pos >PCN  Donette LarryMelanie Andrae Claunch, CNM  01/31/2017, 11:38 AM

## 2017-01-31 NOTE — Anesthesia Preprocedure Evaluation (Signed)
Anesthesia Evaluation  Patient identified by MRN, date of birth, ID band Patient awake    Reviewed: Allergy & Precautions, H&P , NPO status , Patient's Chart, lab work & pertinent test results  History of Anesthesia Complications (+) PONVNegative for: history of anesthetic complications  Airway Mallampati: II  TM Distance: >3 FB Neck ROM: full    Dental no notable dental hx. (+) Teeth Intact   Pulmonary Current Smoker,    Pulmonary exam normal breath sounds clear to auscultation       Cardiovascular negative cardio ROS Normal cardiovascular exam Rhythm:regular Rate:Normal     Neuro/Psych negative neurological ROS  negative psych ROS   GI/Hepatic Neg liver ROS, GERD  ,  Endo/Other  diabetes, Gestational, Oral Hypoglycemic Agents  Renal/GU negative Renal ROS     Musculoskeletal   Abdominal   Peds  Hematology  (+) anemia ,   Anesthesia Other Findings   Reproductive/Obstetrics (+) Pregnancy                             Anesthesia Physical Anesthesia Plan  ASA: III  Anesthesia Plan: Epidural   Post-op Pain Management:    Induction:   PONV Risk Score and Plan:   Airway Management Planned:   Additional Equipment:   Intra-op Plan:   Post-operative Plan:   Informed Consent: I have reviewed the patients History and Physical, chart, labs and discussed the procedure including the risks, benefits and alternatives for the proposed anesthesia with the patient or authorized representative who has indicated his/her understanding and acceptance.     Plan Discussed with:   Anesthesia Plan Comments:         Anesthesia Quick Evaluation

## 2017-02-01 MED ORDER — ONDANSETRON HCL 4 MG/2ML IJ SOLN: 4.0000 mg | INTRAMUSCULAR | Status: DC | PRN

## 2017-02-01 MED ORDER — BENZOCAINE-MENTHOL 20-0.5 % EX AERO: 1.0000 "application " | INHALATION_SPRAY | CUTANEOUS | Status: DC | PRN

## 2017-02-01 MED ORDER — DIPHENHYDRAMINE HCL 25 MG PO CAPS: 25.0000 mg | ORAL_CAPSULE | Freq: Four times a day (QID) | ORAL | Status: DC | PRN

## 2017-02-01 MED ORDER — SIMETHICONE 80 MG PO CHEW: 80.0000 mg | CHEWABLE_TABLET | ORAL | Status: DC | PRN

## 2017-02-01 MED ORDER — METHYLERGONOVINE MALEATE 0.2 MG/ML IJ SOLN: 0.2000 mg | INTRAMUSCULAR | Status: DC | PRN

## 2017-02-01 MED ORDER — FAMOTIDINE 20 MG PO TABS
20.0000 mg | ORAL_TABLET | ORAL | Status: AC
  Administered 2017-02-01: 20 mg via ORAL
  Filled 2017-02-01: qty 1

## 2017-02-01 MED ORDER — METHYLERGONOVINE MALEATE 0.2 MG PO TABS: 0.2000 mg | ORAL_TABLET | ORAL | Status: DC | PRN

## 2017-02-01 MED ORDER — PRENATAL MULTIVITAMIN CH
1.0000 | ORAL_TABLET | Freq: Every day | ORAL | Status: DC
  Administered 2017-02-01 – 2017-02-02 (×2): 1 via ORAL
  Filled 2017-02-01 (×2): qty 1

## 2017-02-01 MED ORDER — DIBUCAINE 1 % RE OINT: 1.0000 "application " | TOPICAL_OINTMENT | RECTAL | Status: DC | PRN

## 2017-02-01 MED ORDER — ONDANSETRON HCL 4 MG PO TABS: 4.0000 mg | ORAL_TABLET | ORAL | Status: DC | PRN

## 2017-02-01 MED ORDER — ACETAMINOPHEN 325 MG PO TABS
650.0000 mg | ORAL_TABLET | ORAL | Status: DC | PRN
  Administered 2017-02-01 – 2017-02-02 (×3): 650 mg via ORAL
  Filled 2017-02-01 (×3): qty 2

## 2017-02-01 MED ORDER — ZOLPIDEM TARTRATE 5 MG PO TABS: 5.0000 mg | ORAL_TABLET | Freq: Every evening | ORAL | Status: DC | PRN

## 2017-02-01 MED ORDER — MEASLES, MUMPS & RUBELLA VAC ~~LOC~~ INJ
0.5000 mL | INJECTION | Freq: Once | SUBCUTANEOUS | Status: DC
  Filled 2017-02-01: qty 0.5

## 2017-02-01 MED ORDER — FLEET ENEMA 7-19 GM/118ML RE ENEM: 1.0000 | ENEMA | Freq: Every day | RECTAL | Status: DC | PRN

## 2017-02-01 MED ORDER — DOCUSATE SODIUM 100 MG PO CAPS
100.0000 mg | ORAL_CAPSULE | Freq: Two times a day (BID) | ORAL | Status: DC
  Administered 2017-02-01 (×2): 100 mg via ORAL
  Filled 2017-02-01 (×2): qty 1

## 2017-02-01 MED ORDER — FERROUS SULFATE 325 (65 FE) MG PO TABS
325.0000 mg | ORAL_TABLET | Freq: Two times a day (BID) | ORAL | Status: DC
  Administered 2017-02-01 – 2017-02-02 (×3): 325 mg via ORAL
  Filled 2017-02-01 (×3): qty 1

## 2017-02-01 MED ORDER — COCONUT OIL OIL: 1.0000 "application " | TOPICAL_OIL | Status: DC | PRN

## 2017-02-01 MED ORDER — OXYCODONE HCL 5 MG PO TABS: 5.0000 mg | ORAL_TABLET | ORAL | Status: DC | PRN

## 2017-02-01 MED ORDER — WITCH HAZEL-GLYCERIN EX PADS: 1.0000 "application " | MEDICATED_PAD | CUTANEOUS | Status: DC | PRN

## 2017-02-01 MED ORDER — BISACODYL 10 MG RE SUPP: 10.0000 mg | Freq: Every day | RECTAL | Status: DC | PRN

## 2017-02-01 MED ORDER — TETANUS-DIPHTH-ACELL PERTUSSIS 5-2.5-18.5 LF-MCG/0.5 IM SUSP: 0.5000 mL | Freq: Once | INTRAMUSCULAR | Status: DC

## 2017-02-01 MED ORDER — OXYCODONE HCL 5 MG PO TABS: 10.0000 mg | ORAL_TABLET | ORAL | Status: DC | PRN

## 2017-02-01 NOTE — Progress Notes (Signed)
POSTPARTUM PROGRESS NOTE  Post Partum Day 1 Subjective:   Catherine Ruiz is a 26 y.o. G3P1011 2933w1d s/p NSVD.  No acute events overnight.  Pt denies problems with ambulating, voiding or po intake.  She denies nausea or vomiting.  Pain is well controlled.   Lochia Minimal.   Objective: Blood pressure (!) 104/55, pulse 72, temperature 98.1 F (36.7 C), temperature source Oral, resp. rate 18, height 5\' 4"  (1.626 m), weight 173 lb (78.5 kg), last menstrual period 05/03/2016, SpO2 99 %.  Physical Exam:  General: alert, cooperative and no distress Lochia:normal flow Chest: no respiratory distress Heart:regular rate, distal pulses intact Abdomen: soft, nontender,  Uterine Fundus: firm, appropriately tender DVT Evaluation: No calf swelling or tenderness Extremities: no  edema  Recent Labs    01/31/17 0740  HGB 10.9*  HCT 33.4*    Assessment/Plan:  ASSESSMENT: Catherine SchilderMichelle L Schadt is a 26 y.o. G3P1011 7133w1d s/p NSVD  Plan for discharge tomorrow   LOS: 1 day   Mc Hollen MossMD 02/01/2017, 10:23 AM

## 2017-02-01 NOTE — Progress Notes (Signed)
CSW received consult due to score 10 on Edinburgh Depression Screen.    When CSW arrived, MOB was resting on the couch, FOB was holding infant, and MOB's female guest was observing infant.  MOB gave CSW permission to meet with MOB while MOB's guest were present. CSW inquired about MOB's thoughts and feeling since giving birth.  MOB expressed that MOB is feeling better since showering. It appeared that MOB and FOB are excited about parenting as they joked and smile while engaging with infant.   CSW provided education regarding Baby Blues vs PMADs.  CSW encouraged MOB to evaluate her mental health throughout the postpartum period with the use of the New Mom Checklist developed by Postpartum Progress and notify a medical professional if symptoms arise.    CSW assessed for safety and MOB denied SI and HI.  MOB did not present with any acute symptoms and communicated that the they has a wealth of supporters.   There are no barriers to d/c.  Juanya Villavicencio Boyd-Gilyard, MSW, LCSW Clinical Social Work [phone] 

## 2017-02-01 NOTE — Anesthesia Postprocedure Evaluation (Signed)
Anesthesia Post Note  Patient: Adele SchilderMichelle L Krouse  Procedure(s) Performed: AN AD HOC LABOR EPIDURAL     Patient location during evaluation: Mother Baby Anesthesia Type: Epidural Level of consciousness: awake, awake and alert, oriented and patient cooperative Pain management: pain level controlled Vital Signs Assessment: post-procedure vital signs reviewed and stable Respiratory status: spontaneous breathing, nonlabored ventilation and respiratory function stable Cardiovascular status: stable Postop Assessment: no headache, no backache, patient able to bend at knees and no apparent nausea or vomiting Anesthetic complications: no    Last Vitals:  Vitals:   02/01/17 0040 02/01/17 0649  BP: (!) 104/50 (!) 104/55  Pulse: 79 72  Resp: 16 18  Temp:    SpO2:      Last Pain:  Vitals:   02/01/17 0644  TempSrc:   PainSc: 5    Pain Goal:                 Uziel Covault L

## 2017-02-01 NOTE — Plan of Care (Signed)
Patient has a flat affect; Catherine Ruiz was 10, social work consult ordered.  Patient is bonding well with baby and has FOB and Mother in the room as support.  She is bonding well with her newborn and is comfortable with caring for him.  Will continue to monitor.

## 2017-02-02 LAB — BIRTH TISSUE RECOVERY COLLECTION (PLACENTA DONATION)

## 2017-02-02 NOTE — Procedures (Signed)
Circumcision Procedure Note Preoperative diagnosis: Desires Neonatal Circumcision  Postoperative diagnosis: same  Procedure: Neonatal Circumcision  Operator(s): Cornelia Copaharlie Denzil Bristol, Jr. MD  Preprocedure counseling: The risks, benefits, and alternatives of the procedure were discussed with the patient's parent/guardian.  Procedure:  A timeout was performed prior to starting the procedure. The infant was laid in a supine position, and an alcohol prep was done. Next, 1mL of 1% lidocaine without epinephrine was used to anesthetize the penis with a subcutaneous ring block. The surgical field was prepped and draped in usual sterile fashion. A pacifier with sucrose water was used to aid anesthesia.  A dorsal slit was made after clamping the foreskin. The foreskin was retracted and adhesions were removed bluntly. The 1.3 cm Gomco clamp was placed in usual fashion ensuring the dorsal slit was completely included and that the amount of foreskin was symmetric on all sides. After securing the Gomco clamp to ensure hemostasis, the foreskin was cut with a scalpel. The Gomco clamp was removed. Hemostasis was assured. The wound was dressed with 1/2" sponge foam.   Cornelia Copaharlie Michal Callicott, Jr MD Attending Center for [facility](Faculty Practice)

## 2017-02-02 NOTE — Discharge Summary (Signed)
OB Discharge Summary     Patient Name: Catherine SchilderMichelle L Landstrom DOB: 12/08/1990 MRN: [id number]  Date of admission: 01/31/2017 Delivering MD: Jacklyn ShellRESENZO-DISHMON, FRANCES   Date of discharge: 02/02/2017  Admitting diagnosis: INDUCTION Intrauterine pregnancy: 6364w2d     Secondary diagnosis:  Active Problems:   GDM, class A2  Additional problems: NA     Discharge diagnosis: Term Pregnancy Delivered                                                                                                Post partum procedures:NA  Augmentation: Pitocin  Complications: None  Hospital course:  Induction of Labor With Vaginal Delivery   26 y.o. yo G3P1011 at 6964w2d was admitted to the hospital 01/31/2017 for induction of labor.  Indication for induction: A1 DM.  Patient had an uncomplicated labor course as follows: Membrane Rupture Time/Date: 1:20 PM ,01/31/2017   Intrapartum Procedures: Episiotomy: None [1]                                         Lacerations:  1st degree [2]  Patient had delivery of a Viable infant.  Information for the patient's newborn:  Catherine Ruiz, Boy [name] [[id number]][[id number]]  Delivery Method: Vag-Spont   01/31/2017  Details of delivery can be found in separate delivery note.  Patient had a routine postpartum course. Patient is discharged home 02/02/17.  Physical exam  Vitals:   02/01/17 0649 02/01/17 1458 02/01/17 1804 02/02/17 0600  BP: (!) 104/55 (!) 114/56 111/71 (!) 97/55  Pulse: 72  82 66  Resp: 18  20 18   Temp:  98.1 F (36.7 C) 98.2 F (36.8 C) 98.5 F (36.9 C)  TempSrc:  Oral Oral Oral  SpO2:    99%  Weight:      Height:       General: alert, cooperative and no distress Lochia: appropriate Uterine Fundus: firm Incision: N/A DVT Evaluation: No evidence of DVT seen on physical exam. Labs: Lab Results  Component Value Date   WBC 12.8 (H) 01/31/2017   HGB 10.9 (L) 01/31/2017   HCT 33.4 (L) 01/31/2017   MCV 92.3 01/31/2017   PLT 393 01/31/2017   CMP Latest Ref  Rng & Units 12/31/2016  Glucose 65 - 99 mg/dL 86  BUN 6 - 20 mg/dL 9  Creatinine 2.530.44 - 6.641.00 mg/dL 4.030.56  Sodium 474135 - 259145 mmol/L 134(L)  Potassium 3.5 - 5.1 mmol/L 3.9  Chloride 101 - 111 mmol/L 103  CO2 22 - 32 mmol/L 22  Calcium 8.9 - 10.3 mg/dL 9.1  Total Protein 6.5 - 8.1 g/dL 6.7  Total Bilirubin 0.3 - 1.2 mg/dL 5.6(L0.2(L)  Alkaline Phos 38 - 126 U/L 175(H)  AST 15 - 41 U/L 17  ALT 14 - 54 U/L 7(L)    Discharge instruction: per After Visit Summary and "Baby and Me Booklet".  After visit meds:  Allergies as of 02/02/2017      Reactions   Aspirin Nausea Only   Keflex [cephalexin] Nausea And  Vomiting   Sulfa Antibiotics Rash   Joints "lock up"      Medication List    STOP taking these medications   freestyle lancets   FREESTYLE LITE Devi   glucose blood test strip Commonly known as:  FREESTYLE LITE   metFORMIN 500 MG tablet Commonly known as:  GLUCOPHAGE   multivitamin-prenatal 27-0.8 MG Tabs tablet   ranitidine 150 MG tablet Commonly known as:  ZANTAC       Diet: routine diet  Activity: Advance as tolerated. Pelvic rest for 6 weeks.   Outpatient follow up:6 weeks Follow up Appt: Future Appointments  Date Time Provider Department Center  03/07/2017  9:30 AM Anyanwu, Jethro BastosUgonna A, MD [facility]   Follow up Visit:No Follow-up on file.  Postpartum contraception: Undecided  Newborn Data: Live born female  Birth Weight: 7 lb 8.6 oz (3419 g) APGAR: 9, 9  Newborn Delivery   Birth date/time:  01/31/2017 20:56:00 Delivery type:  Vaginal, Spontaneous     Baby Feeding: Bottle and Breast Disposition:home with mother   02/02/2017 Catherine Ruiz Durwin Davisson, CNM

## 2017-02-02 NOTE — Progress Notes (Signed)
D/w pt and husband re: circ r/b/a and they are amenable to it  Cornelia Copaharlie Elajah Kunsman, Jr MD Attending Center [facility] (Faculty Practice) 02/02/2017 Time: [phone]

## 2017-03-07 ENCOUNTER — Ambulatory Visit (INDEPENDENT_AMBULATORY_CARE_PROVIDER_SITE_OTHER): Payer: 59 | Admitting: Obstetrics & Gynecology

## 2017-03-07 ENCOUNTER — Encounter: Payer: Self-pay | Admitting: Obstetrics & Gynecology

## 2017-03-07 DIAGNOSIS — Z30011 Encounter for initial prescription of contraceptive pills: Secondary | ICD-10-CM

## 2017-03-07 DIAGNOSIS — Z8632 Personal history of gestational diabetes: Secondary | ICD-10-CM

## 2017-03-07 MED ORDER — NORGESTREL-ETHINYL ESTRADIOL 0.3-30 MG-MCG PO TABS: 1.0000 | ORAL_TABLET | Freq: Every day | ORAL | 5 refills | Status: DC

## 2017-03-07 NOTE — Patient Instructions (Signed)
Return to clinic for any scheduled appointments or for any gynecologic concerns as needed.   

## 2017-03-07 NOTE — Progress Notes (Signed)
Subjective:     Catherine Ruiz is a 26 y.o. 523P1011 female who presents for a postpartum visit. She is  5 weeks  postpartum following a delivery on 01/31/2017. I have fully reviewed the prenatal and intrapartum course.  Patient had A2GDM, controlled on oral hypoglycemic therapy. The delivery was at 39 gestational weeks. Outcome: vaginal. Anesthesia: Epidural . Postpartum course has been uncomplicated. Baby's course has been uncomplicated . Baby is feeding by bottle. Bleeding not at this. Bowel function is normal. Bladder function is normal. Patient is not sexually active. Contraception method is nothing at this time but, desire pills. Postpartum depression screening: negative  The following portions of the patient's history were reviewed and updated as appropriate: allergies, current medications, past family history, past medical history, past social history, past surgical history and problem list.  Normal pap smear in 06/2016.  Review of Systems Pertinent items noted in HPI and remainder of comprehensive ROS otherwise negative.    Objective:    BP 115/70   Pulse (!) 114   LMP 05/03/2016 (Exact Date)   General:  alert and no distress   Breasts:  deferred  Lungs: clear to auscultation bilaterally  Heart:  regular rate and rhythm, S1, S2 normal, no murmur, click, rub or gallop  Abdomen: soft, non-tender; bowel sounds normal; no masses,  no organomegaly  Pevic:  not evaluated        Assessment:     Normal postpartum exam. Pap smear not done at today's visit.   Plan:    1. Contraception: OCP (estrogen/progesterone), Cryselle prescribed 2. Will return for postpartum 2 hr GTT soon 3. Follow up as needed.     Jaynie CollinsUGONNA  Cletus Mehlhoff, MD, FACOG Obstetrician & Gynecologist, [facility], [facility]

## 2017-05-07 DIAGNOSIS — J069 Acute upper respiratory infection, unspecified: Secondary | ICD-10-CM | POA: Diagnosis not present

## 2017-05-28 DIAGNOSIS — H9201 Otalgia, right ear: Secondary | ICD-10-CM | POA: Diagnosis not present

## 2017-07-29 ENCOUNTER — Telehealth: Payer: Self-pay | Admitting: *Deleted

## 2017-07-29 DIAGNOSIS — Z30011 Encounter for initial prescription of contraceptive pills: Secondary | ICD-10-CM

## 2017-07-29 MED ORDER — NORGESTREL-ETHINYL ESTRADIOL 0.3-30 MG-MCG PO TABS: 1.0000 | ORAL_TABLET | Freq: Every day | ORAL | 5 refills | Status: DC

## 2017-07-29 NOTE — Telephone Encounter (Signed)
-----   Message from Lindell Spar, [place] sent at 07/29/2017  4:03 PM EDT ----- Regarding: rx transfer pharmacy Contact: [phone] Please and BC rx to [facility] in [place], [place]

## 2017-07-31 ENCOUNTER — Other Ambulatory Visit: Payer: Self-pay

## 2017-07-31 DIAGNOSIS — Z30011 Encounter for initial prescription of contraceptive pills: Secondary | ICD-10-CM

## 2017-07-31 MED ORDER — NORGESTREL-ETHINYL ESTRADIOL 0.3-30 MG-MCG PO TABS: 1.0000 | ORAL_TABLET | Freq: Every day | ORAL | 5 refills | Status: DC

## 2017-10-14 ENCOUNTER — Other Ambulatory Visit: Payer: Self-pay

## 2017-10-14 DIAGNOSIS — Z30011 Encounter for initial prescription of contraceptive pills: Secondary | ICD-10-CM

## 2017-10-14 MED ORDER — NORGESTREL-ETHINYL ESTRADIOL 0.3-30 MG-MCG PO TABS: 1.0000 | ORAL_TABLET | Freq: Every day | ORAL | 5 refills | Status: DC

## 2017-10-14 NOTE — Telephone Encounter (Signed)
Refill on birth control 

## 2018-03-29 ENCOUNTER — Other Ambulatory Visit: Payer: Self-pay | Admitting: Obstetrics & Gynecology

## 2018-03-29 DIAGNOSIS — Z30011 Encounter for initial prescription of contraceptive pills: Secondary | ICD-10-CM
